# Patient Record
Sex: Female | Born: 1995
Health system: Southern US, Community
[De-identification: ages and names within clinical notes are randomized; demographics above are authoritative.]

## PROBLEM LIST (undated history)

## (undated) DIAGNOSIS — F419 Anxiety disorder, unspecified: Secondary | ICD-10-CM

## (undated) DIAGNOSIS — F329 Major depressive disorder, single episode, unspecified: Secondary | ICD-10-CM

## (undated) DIAGNOSIS — D649 Anemia, unspecified: Secondary | ICD-10-CM

## (undated) DIAGNOSIS — K589 Irritable bowel syndrome without diarrhea: Secondary | ICD-10-CM

## (undated) DIAGNOSIS — R1115 Cyclical vomiting syndrome unrelated to migraine: Secondary | ICD-10-CM

## (undated) DIAGNOSIS — F32A Depression, unspecified: Secondary | ICD-10-CM

## (undated) DIAGNOSIS — Z8489 Family history of other specified conditions: Secondary | ICD-10-CM

## (undated) DIAGNOSIS — B019 Varicella without complication: Secondary | ICD-10-CM

## (undated) HISTORY — DX: Major depressive disorder, single episode, unspecified: F32.9

## (undated) HISTORY — DX: Anxiety disorder, unspecified: F41.9

## (undated) HISTORY — PX: NO PAST SURGERIES: SHX2092

## (undated) HISTORY — DX: Irritable bowel syndrome, unspecified: K58.9

## (undated) HISTORY — DX: Cyclical vomiting syndrome unrelated to migraine: R11.15

## (undated) HISTORY — DX: Varicella without complication: B01.9

## (undated) HISTORY — DX: Depression, unspecified: F32.A

## (undated) HISTORY — DX: Anemia, unspecified: D64.9

---
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
## 2016-08-01 DIAGNOSIS — Z01419 Encounter for gynecological examination (general) (routine) without abnormal findings: Secondary | ICD-10-CM | POA: Diagnosis not present

## 2017-07-23 DIAGNOSIS — Z23 Encounter for immunization: Secondary | ICD-10-CM | POA: Diagnosis not present

## 2017-08-20 ENCOUNTER — Ambulatory Visit: Payer: 59 | Admitting: Physician Assistant

## 2017-08-20 ENCOUNTER — Encounter: Payer: Self-pay | Admitting: Physician Assistant

## 2017-08-20 ENCOUNTER — Other Ambulatory Visit: Payer: Self-pay

## 2017-08-20 VITALS — BP 110/60 | HR 101 | Temp 99.0°F | Resp 16 | Ht 63.0 in | Wt 141.6 lb

## 2017-08-20 DIAGNOSIS — Z113 Encounter for screening for infections with a predominantly sexual mode of transmission: Secondary | ICD-10-CM

## 2017-08-20 DIAGNOSIS — F411 Generalized anxiety disorder: Secondary | ICD-10-CM

## 2017-08-20 DIAGNOSIS — Z8619 Personal history of other infectious and parasitic diseases: Secondary | ICD-10-CM | POA: Insufficient documentation

## 2017-08-20 DIAGNOSIS — F32A Depression, unspecified: Secondary | ICD-10-CM

## 2017-08-20 DIAGNOSIS — Z23 Encounter for immunization: Secondary | ICD-10-CM | POA: Diagnosis not present

## 2017-08-20 DIAGNOSIS — F329 Major depressive disorder, single episode, unspecified: Secondary | ICD-10-CM | POA: Diagnosis not present

## 2017-08-20 DIAGNOSIS — D649 Anemia, unspecified: Secondary | ICD-10-CM | POA: Insufficient documentation

## 2017-08-20 DIAGNOSIS — D241 Benign neoplasm of right breast: Secondary | ICD-10-CM | POA: Insufficient documentation

## 2017-08-20 DIAGNOSIS — D242 Benign neoplasm of left breast: Secondary | ICD-10-CM
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
## 2017-08-20 NOTE — Patient Instructions (Signed)
     IF you received an x-ray today, you will receive an invoice from [facility]. Please contact [facility] at [phone] with questions or concerns regarding your invoice.   IF you received labwork today, you will receive an invoice from [facility]. Please contact [facility] at [phone] with questions or concerns regarding your invoice.   Our billing staff will not be able to assist you with questions regarding bills from these companies.  You will be contacted with the lab results as soon as they are available. The fastest way to get your results is to activate your My Chart account. Instructions are located on the last page of this paperwork. If you have not heard from us regarding the results in 2 weeks, please contact this office.     

## 2017-08-22 LAB — HSV(HERPES SIMPLEX VRS) I + II AB-IGG: HSV 2 IgG, Type Spec: <0.91 index (ref 0.00–0.90)

## 2017-08-22 LAB — WET PREP FOR TRICH, YEAST, CLUE
Clue Cell Exam: NEGATIVE
TRICHOMONAS EXAM: NEGATIVE
Yeast Exam: NEGATIVE

## 2017-08-22 LAB — HIV ANTIBODY (ROUTINE TESTING W REFLEX): HIV Screen 4th Generation wRfx: NONREACTIVE

## 2017-08-22 LAB — HEPATITIS C ANTIBODY: Hep C Virus Ab: <0.1 s/co ratio (ref 0.0–0.9)

## 2017-08-22 LAB — HEPATITIS B SURFACE ANTIBODY, QUANTITATIVE: HEPATITIS B SURF AB QUANT: 29.3 m[IU]/mL

## 2017-08-22 LAB — RPR: RPR Ser Ql: NONREACTIVE

## 2017-08-22 LAB — HEPATITIS B SURFACE ANTIGEN: Hepatitis B Surface Ag: NEGATIVE

## 2017-08-22 LAB — GC/CHLAMYDIA PROBE AMP
Chlamydia trachomatis, NAA: NEGATIVE
NEISSERIA GONORRHOEAE BY PCR: NEGATIVE

## 2017-08-24 ENCOUNTER — Encounter: Payer: Self-pay | Admitting: Physician Assistant

## 2017-08-24 DIAGNOSIS — Z789 Other specified health status: Secondary | ICD-10-CM | POA: Insufficient documentation

## 2017-09-03 ENCOUNTER — Encounter: Payer: Self-pay | Admitting: Physician Assistant

## 2017-09-07 NOTE — Assessment & Plan Note (Signed)
Stable. Continue therapy.

## 2017-09-07 NOTE — Assessment & Plan Note (Signed)
Stable. COntinue therapy.

## 2017-09-27 ENCOUNTER — Encounter: Payer: Self-pay | Admitting: Physician Assistant

## 2018-04-17 DIAGNOSIS — H1031 Unspecified acute conjunctivitis, right eye: Secondary | ICD-10-CM | POA: Diagnosis not present

## 2018-04-20 ENCOUNTER — Other Ambulatory Visit: Payer: Self-pay

## 2018-04-20 ENCOUNTER — Encounter: Payer: Self-pay | Admitting: Physician Assistant

## 2018-04-20 ENCOUNTER — Ambulatory Visit: Payer: 59 | Admitting: Physician Assistant

## 2018-04-20 VITALS — BP 113/69 | HR 83 | Temp 99.3°F | Resp 18 | Ht 63.0 in | Wt 144.6 lb

## 2018-04-20 DIAGNOSIS — L03213 Periorbital cellulitis: Secondary | ICD-10-CM | POA: Diagnosis not present

## 2018-04-20 MED ORDER — SULFAMETHOXAZOLE-TRIMETHOPRIM 800-160 MG PO TABS: 1.0000 | ORAL_TABLET | Freq: Two times a day (BID) | ORAL | 0 refills | Status: DC

## 2018-04-20 MED ORDER — CEPHALEXIN 500 MG PO CAPS: 500.0000 mg | ORAL_CAPSULE | Freq: Three times a day (TID) | ORAL | 0 refills | Status: DC

## 2018-04-20 NOTE — Patient Instructions (Addendum)
Please start antibiotics as prescribed.  You may want to take a probiotic since you will be on two antibiotics. If you develop any fever, pain with eye movements, visual disturbance, nausea, vomiting, or diarrhea, please seek care immediately.  ]Thank you for letting me participate in your health and well being.     If you have lab work done today you will be contacted with your lab results within the next 2 weeks.  If you have not heard from [place] then please contact us. The fastest way to get your results is to register for My Chart.   Preseptal Cellulitis, Adult Preseptal cellulitis-also called periorbital cellulitis-is an infection that can affect your eyelid and the soft tissues or skin that surround your eye. The infection may also affect the structures that produce and drain your tears. It does not affect your eye itself. What are the causes? This condition may be caused by:  Bacterial infection.  Long-term (chronic) sinus infections.  An object (foreign body) that is stuck behind the eye.  An injury that: ? Goes through the eyelid tissues. ? Causes an infection, such as an insect sting.  Fracture of the bone around the eye.  Infections that have spread from the eyelid or other structures around the eye.  Bite wounds.  Inflammation or infection of the lining membranes of the brain (meningitis).  An infection in the blood (septicemia).  Dental infection (abscess).  Viral infection. This is rare.  What increases the risk? Risk factors for preseptal cellulitis include:  Participating in activities that increase your risk of trauma to the face or head, such as boxing or high-speed activities.  Having a weakened defense system (immune system).  Medical conditions, such as nasal polyps, that increase your risk for frequent or recurrent sinus infections.  Not receiving regular dental care.  What are the signs or symptoms? Symptoms of this condition usually come on  suddenly. Symptoms may include:  Red, hot, and swollen eyelids.  Fever.  Difficulty opening your eye.  Eye pain.  How is this diagnosed? This condition may be diagnosed by an eye exam. You may also have tests, such as:  Blood tests.  CT scan.  MRI.  Spinal tap (lumbar puncture). This is a procedure that involves removing and examining a small amount of the fluid that surrounds the brain and spinal cord. This checks for meningitis.  How is this treated? Treatment for this condition will include antibiotic medicines. These may be given by mouth (orally), through an IV, or as a shot. Your health care provider may also recommend nasal decongestants to reduce swelling. Follow these instructions at home:  Take your antibiotic medicine as directed by your health care provider. Finish all of it even if you start to feel better.  Take medicines only as directed by your health care provider.  Drink enough fluid to keep your urine clear or pale yellow.  Do not use any tobacco products, including cigarettes, chewing tobacco, or electronic cigarettes. If you need help quitting, ask your health care provider.  Keep all follow-up visits as directed by your health care provider. These include any visits with an eye specialist (ophthalmologist) or dentist. Contact a health care provider if:  You have a fever.  Your eyelids become more red, warm, or swollen.  You have new symptoms.  Your symptoms do not get better with treatment. Get help right away if:  You develop double vision, or your vision becomes blurred or worsens in any way.  You have trouble moving your eyes.  Your eye looks like it is sticking out or bulging out (proptosis).  You develop a severe headache, severe neck pain, or neck stiffness.  You develop repeated vomiting. This information is not intended to replace advice given to you by your health care provider. Make sure you discuss any questions you have with your  health care provider. Document Released: 06/21/2010 Document Revised: 10/25/2015 Document Reviewed: 05/15/2014 Elsevier Interactive Patient Education  2018 [employer].  IF you received an x-ray today, you will receive an invoice from [facility]. Please contact [facility] at [phone] with questions or concerns regarding your invoice.   IF you received labwork today, you will receive an invoice from [facility]. Please contact [facility] at [phone] with questions or concerns regarding your invoice.   Our billing staff will not be able to assist you with questions regarding bills from these companies.  You will be contacted with the lab results as soon as they are available. The fastest way to get your results is to activate your My Chart account. Instructions are located on the last page of this paperwork. If you have not heard from [place] regarding the results in 2 weeks, please contact this office.

## 2018-04-20 NOTE — Progress Notes (Signed)
Shelly Petty  MRN: [id number] DOB: 22-Jul-1995  Subjective:  Shelly Petty is a 22 y.o. female seen in office today for a chief complaint of right eye issues x 5 days. Went to urgent care 3 days and got dx w/ conjunctivitis. Given Rx for tobramycin eye drops. Using them as prescribed. Has not noticed any difference but it was never the eye that was bothering pt, its the lower eyelid. Has swelling, redness, and discomfort of lower eyelid and pain. Just had 3D xray of sinuses and was told they were clear today at the dentist. Denies eye itching, photophobia, eye redness, eye discharge, visual disturbance, pain with eye movements, and foreign body sensation. Denies trauma. Does not wear contact lens.  Works with the community.  No history of MRSA.  Review of Systems  Constitutional: Negative for chills, diaphoresis and fever.    Patient Active Problem List   Diagnosis Date Noted  . Immune to hepatitis B 08/24/2017  . Anemia 08/20/2017  . History of varicella 08/20/2017  . Fibroadenoma of both breasts 08/20/2017  . Anxiety state 08/17/2012  . Depression 09/05/2011    Current Outpatient Medications on File Prior to Visit  Medication Sig Dispense Refill  . tobramycin (TOBREX) 0.3 % ophthalmic solution      No current facility-administered medications on file prior to visit.     Allergies  Allergen Reactions  . Prozac [Fluoxetine Hcl] Nausea And Vomiting  . Escitalopram Oxalate Nausea And Vomiting    Pt does not do well with SSRI medications     Objective:  BP 113/69   Pulse 83   Temp 99.3 F (37.4 C) (Oral)   Resp 18   Ht 5\' 3"  (1.6 m)   Wt 144 lb 9.6 oz (65.6 kg)   LMP 03/20/2018   SpO2 97%   BMI 25.61 kg/m   Physical Exam  Constitutional: She is oriented to person, place, and time. She appears well-developed and well-nourished. No distress.  HENT:  Head: Normocephalic and atraumatic.  Eyes: Pupils are equal, round, and reactive to light. Conjunctivae and EOM are  normal. Right eye exhibits no discharge and no hordeolum. Left eye exhibits no discharge and no hordeolum. Right conjunctiva is not injected. Left conjunctiva is not injected.  Fundoscopic exam:      The right eye shows no AV nicking, no exudate, no hemorrhage and no papilledema. The right eye shows red reflex.       The left eye shows no AV nicking, no exudate, no hemorrhage and no papilledema. The left eye shows red reflex.    Mild erythema and edema of inferior periorbital region extending to cheek area. No overlying TTP. No pain with EOMs. See image below.    Neck: Normal range of motion.  Pulmonary/Chest: Effort normal.  Neurological: She is alert and oriented to person, place, and time.  Skin: Skin is warm and dry.  Psychiatric: She has a normal mood and affect.  Vitals reviewed.         Visual Acuity Screening   Right eye Left eye Both eyes  Without correction: 20/13 20/13 20/13   With correction:        Assessment and Plan :  1. Preseptal cellulitis Concern for preseptal cellulitis. No concern for orbital cellulitis.  Patient is overall well-appearing, no acute distress.  Afebrile.  No pain with EOMs.  Recommend oral antibiotics with close follow-up.  If any symptoms worsen or develops new concerning symptoms, please return office for further evaluation. - sulfamethoxazole-trimethoprim (  BACTRIM DS,SEPTRA DS) 800-160 MG tablet; Take 1 tablet by mouth 2 (two) times daily.  Dispense: 10 tablet; Refill: 0 - cephALEXin (KEFLEX) 500 MG capsule; Take 1 capsule (500 mg total) by mouth 3 (three) times daily for 5 days.  Dispense: 15 capsule; Refill: 0  Side effects, risks, benefits, and alternatives of the medications and treatment plan prescribed today were discussed, and patient expressed understanding of the instructions given. No barriers to understanding were identified. Red flags discussed in detail. Pt expressed understanding regarding what to do in case of emergency/urgent  symptoms.  Tenna Delaine PA-C  [facility] 04/20/2018 5:05 PM

## 2018-04-22 ENCOUNTER — Encounter: Payer: Self-pay | Admitting: Physician Assistant

## 2018-04-22 ENCOUNTER — Encounter (HOSPITAL_BASED_OUTPATIENT_CLINIC_OR_DEPARTMENT_OTHER): Payer: Self-pay

## 2018-04-22 ENCOUNTER — Ambulatory Visit: Payer: Self-pay

## 2018-04-22 ENCOUNTER — Other Ambulatory Visit: Payer: Self-pay

## 2018-04-22 ENCOUNTER — Emergency Department (HOSPITAL_BASED_OUTPATIENT_CLINIC_OR_DEPARTMENT_OTHER): Payer: 59

## 2018-04-22 ENCOUNTER — Emergency Department (HOSPITAL_BASED_OUTPATIENT_CLINIC_OR_DEPARTMENT_OTHER)
Admission: EM | Admit: 2018-04-22 | Discharge: 2018-04-23 | Disposition: A | Discharge status: Home / Self Care | Payer: 59 | Attending: Emergency Medicine | Admitting: Emergency Medicine

## 2018-04-22 DIAGNOSIS — M549 Dorsalgia, unspecified: Secondary | ICD-10-CM | POA: Insufficient documentation

## 2018-04-22 DIAGNOSIS — R112 Nausea with vomiting, unspecified: Secondary | ICD-10-CM | POA: Insufficient documentation

## 2018-04-22 DIAGNOSIS — R419 Unspecified symptoms and signs involving cognitive functions and awareness: Secondary | ICD-10-CM | POA: Insufficient documentation

## 2018-04-22 DIAGNOSIS — Z79899 Other long term (current) drug therapy: Secondary | ICD-10-CM | POA: Diagnosis not present

## 2018-04-22 DIAGNOSIS — R1114 Bilious vomiting: Secondary | ICD-10-CM | POA: Diagnosis present

## 2018-04-22 DIAGNOSIS — R6883 Chills (without fever): Secondary | ICD-10-CM | POA: Insufficient documentation

## 2018-04-22 DIAGNOSIS — Z87891 Personal history of nicotine dependence: Secondary | ICD-10-CM | POA: Diagnosis not present

## 2018-04-22 LAB — CBC
HCT: 39.6 % (ref 36.0–46.0)
Hemoglobin: 13.2 g/dL (ref 12.0–15.0)
MCH: 30.1 pg (ref 26.0–34.0)
MCHC: 33.3 g/dL (ref 30.0–36.0)
MCV: 90.4 fL (ref 80.0–100.0)
NRBC: 0 % (ref 0.0–0.2)
PLATELETS: 227 10*3/uL (ref 150–400)
RBC: 4.38 MIL/uL (ref 3.87–5.11)
RDW: 11.7 % (ref 11.5–15.5)
WBC: 11.7 10*3/uL — AB (ref 4.0–10.5)

## 2018-04-22 LAB — URINALYSIS, MICROSCOPIC (REFLEX)

## 2018-04-22 LAB — COMPREHENSIVE METABOLIC PANEL
ALBUMIN: 5.4 g/dL — AB (ref 3.5–5.0)
ALT: 15 U/L (ref 0–44)
ANION GAP: 14 (ref 5–15)
AST: 22 U/L (ref 15–41)
Alkaline Phosphatase: 41 U/L (ref 38–126)
BILIRUBIN TOTAL: 0.8 mg/dL (ref 0.3–1.2)
BUN: 9 mg/dL (ref 6–20)
CHLORIDE: 104 mmol/L (ref 98–111)
CO2: 21 mmol/L — ABNORMAL LOW (ref 22–32)
Calcium: 10.2 mg/dL (ref 8.9–10.3)
Creatinine, Ser: 0.73 mg/dL (ref 0.44–1.00)
GFR calc Af Amer: >60 mL/min (ref >60)
GLUCOSE: 129 mg/dL — AB (ref 70–99)
POTASSIUM: 4.5 mmol/L (ref 3.5–5.1)
Sodium: 139 mmol/L (ref 135–145)
TOTAL PROTEIN: 8.2 g/dL — AB (ref 6.5–8.1)

## 2018-04-22 LAB — URINALYSIS, ROUTINE W REFLEX MICROSCOPIC
BILIRUBIN URINE: NEGATIVE
Glucose, UA: NEGATIVE mg/dL
LEUKOCYTES UA: NEGATIVE
NITRITE: NEGATIVE
PROTEIN: NEGATIVE mg/dL
Specific Gravity, Urine: 1.025 (ref 1.005–1.030)
pH: 7 (ref 5.0–8.0)

## 2018-04-22 LAB — PREGNANCY, URINE: Preg Test, Ur: NEGATIVE

## 2018-04-22 LAB — LIPASE, BLOOD: LIPASE: 34 U/L (ref 11–51)

## 2018-04-22 MED ORDER — PROMETHAZINE HCL 25 MG/ML IJ SOLN: 25.0000 mg | Freq: Once | INTRAMUSCULAR | Status: DC

## 2018-04-22 MED ORDER — ONDANSETRON HCL 4 MG/2ML IJ SOLN
4.0000 mg | Freq: Once | INTRAMUSCULAR | Status: DC
  Filled 2018-04-22: qty 2

## 2018-04-22 MED ORDER — SODIUM CHLORIDE 0.9 % IV BOLUS
1000.0000 mL | Freq: Once | INTRAVENOUS | Status: AC
  Administered 2018-04-22: 1000 mL via INTRAVENOUS

## 2018-04-22 MED ORDER — FAMOTIDINE IN NACL 20-0.9 MG/50ML-% IV SOLN
20.0000 mg | Freq: Once | INTRAVENOUS | Status: AC
  Administered 2018-04-22: 20 mg via INTRAVENOUS
  Filled 2018-04-22: qty 50

## 2018-04-22 MED ORDER — DICYCLOMINE HCL 10 MG/ML IM SOLN
20.0000 mg | Freq: Once | INTRAMUSCULAR | Status: AC
  Administered 2018-04-22: 20 mg via INTRAMUSCULAR
  Filled 2018-04-22: qty 2

## 2018-04-22 MED ORDER — LORAZEPAM 2 MG/ML IJ SOLN
1.0000 mg | Freq: Once | INTRAMUSCULAR | Status: AC
  Administered 2018-04-22: 1 mg via INTRAVENOUS
  Filled 2018-04-22: qty 1

## 2018-04-22 MED ORDER — ONDANSETRON HCL 4 MG/2ML IJ SOLN
4.0000 mg | Freq: Once | INTRAMUSCULAR | Status: AC
  Administered 2018-04-22: 4 mg via INTRAVENOUS
  Filled 2018-04-22: qty 2

## 2018-04-22 MED ORDER — SODIUM CHLORIDE 0.9 % IV BOLUS: 1000.0000 mL | Freq: Once | INTRAVENOUS | Status: DC

## 2018-04-22 NOTE — Telephone Encounter (Signed)
Patient called in with c/o "vomiting." She says "I was put on these 2 antibiotics, Bactrim and Keflex on Tuesday. Today I took them both and vomited. I've been vomiting every 20 minutes since taking and I can't even keep water down. In the past when I had vomiting like this, I was prescribed Phenergan suppositories and I was wondering if something can be done." I advised to go to the ED for evaluation, since unable to keep water down, she asked if there is someone in the office she can talk to. I placed her on hold and called the office and spoke to [place], [facility]. She said to send a note over high priority and she will get someone in clinical to review it. I advised the patient and she says she will wait on a call back.  Reason for Disposition . Caller has URGENT medication question about med that PCP prescribed and triager unable to answer question  Answer Assessment - Initial Assessment Questions 1. SYMPTOMS: "Do you have any symptoms?"     Vomiting 2. SEVERITY: If symptoms are present, ask "Are they mild, moderate or severe?"     Severe-unable to keep anything down, vomiting every 20 minutes.  Protocols used: MEDICATION QUESTION CALL-A-AH

## 2018-04-22 NOTE — ED Notes (Signed)
Pt continued to have episode of emesis following medication administration, they state they are doing better at this point. More warm blankets given and they deny any needs currently.

## 2018-04-22 NOTE — ED Triage Notes (Signed)
Pt states she is started abx for cellulitis on 11/19-now having n/v-to triage in w/c

## 2018-04-22 NOTE — ED Provider Notes (Signed)
[place] EMERGENCY DEPARTMENT Provider Note   CSN: [id number] Arrival date & time: 04/22/18  1727     History   Chief Complaint Chief Complaint  Patient presents with  . Emesis    HPI Shelly [facility]ty is a 22 y.o. gender non-binary person (they,them, theirs) who presents to the emergency department with a chief complaint of emesis.  The patient endorses sudden onset bilious emesis since this morning with nausea, chills, and right-sided mid-back pain.  No fever abdominal pain, dysuria, hematuria, hematemesis, melena, hematochezia, rectal pain, chest pain, dyspnea, or rash.  The patient states that they feel that her stomach has been more upset and they have been feeling more anxious since onset of symptoms.  They report they were initially started on tobramycin for conjunctivitis presenting at urgent care with right eye complaints.  They report they followed up with primary care on 04/20/2018 with no improvement in discomfort, redness, and swelling to the right lower eyelid.  Patient was also seen by dentistry after having some right-sided jaw pain and had a 3D x-ray of the sinuses and was told they were unremarkable.  The patient denies pruritus of the eye, photophobia, eye discharge, blurred vision, diplopia, foreign body sensation, or pain with movements.  Reports the right lower eyelid pain has significantly improved and is nontender at this time.  Reports she was previously on Ativan for anxiety.  Per chart review, the patient has had nausea and vomiting previously but is worse with anxiety.  They are a non-smoker, deny alcohol, and IV or recreational drug use.  The history is provided by the patient. No language interpreter was used.    Past Medical History:  Diagnosis Date  . Anemia   . Chicken pox   . Depression     Patient Active Problem List   Diagnosis Date Noted  . Immune to hepatitis B 08/24/2017  . Anemia 08/20/2017  . History of varicella 08/20/2017    . Fibroadenoma of both breasts 08/20/2017  . Anxiety state 08/17/2012  . Depression 09/05/2011    Past Surgical History:  Procedure Laterality Date  . NO PAST SURGERIES       OB History   None      Home Medications    Prior to Admission medications   Medication Sig Start Date End Date Taking? Authorizing Provider  cephALEXin (KEFLEX) 500 MG capsule Take 1 capsule (500 mg total) by mouth 3 (three) times daily for 5 days. 04/20/18 04/25/18  Tenna Delaine D, PA-C  dicyclomine (BENTYL) 20 MG tablet Take 1 tablet (20 mg total) by mouth 2 (two) times daily. 04/23/18   Icela Glymph A, PA-C  promethazine (PHENERGAN) 25 MG suppository Place 1 suppository (25 mg total) rectally every 6 (six) hours as needed for nausea or vomiting. 04/23/18   Sheela Mcculley A, PA-C  sulfamethoxazole-trimethoprim (BACTRIM DS,SEPTRA DS) 800-160 MG tablet Take 1 tablet by mouth 2 (two) times daily. 04/20/18   Tenna Delaine D, PA-C  tobramycin (TOBREX) 0.3 % ophthalmic solution  04/17/18   [provider]    Family History Family History  Problem Relation Age of Onset  . Mental illness Mother        depression  . Cancer Mother        melanoma  . Diabetes Father   . Hypertension Father   . Hyperlipidemia Father   . Mental illness Sister   . Mental illness Brother   . Drug abuse Brother     Social History Social  History   Tobacco Use  . Smoking status: Former Research scientist (life sciences)  . Smokeless tobacco: Never Used  . Tobacco comment: never regular smoking  Substance Use Topics  . Alcohol use: Yes    Frequency: Never    Comment: once a year, on Christmas, with her mother  . Drug use: Not Currently    Comment: previous marijuana use (last use age 10)     Allergies   Prozac [fluoxetine hcl] and Escitalopram oxalate   Review of Systems Review of Systems  Constitutional: Positive for chills. Negative for activity change and fever.  HENT: Negative for congestion, sinus pressure and sinus  pain.   Eyes: Negative for photophobia, pain, discharge, redness, itching and visual disturbance.  Respiratory: Negative for shortness of breath.   Cardiovascular: Negative for chest pain.  Gastrointestinal: Positive for nausea and vomiting. Negative for abdominal pain, anal bleeding, blood in stool, constipation and diarrhea.  Genitourinary: Negative for dysuria, flank pain, hematuria, urgency, vaginal discharge and vaginal pain.  Musculoskeletal: Positive for back pain.  Skin: Negative for rash.  Allergic/Immunologic: Negative for immunocompromised state.  Neurological: Negative for seizures, weakness, numbness and headaches.  Psychiatric/Behavioral: Negative for confusion.   Physical Exam Updated Vital Signs BP 105/63   Pulse 86   Temp 98.5 F (36.9 C) (Oral)   Resp 18   LMP 04/21/2018   SpO2 100%   Physical Exam  Constitutional: No distress.  HENT:  Head: Normocephalic.  Right Ear: Hearing, tympanic membrane and ear canal normal.  Left Ear: Hearing, tympanic membrane and ear canal normal.  Nose: Nose normal.  No foreign bodies. Right sinus exhibits no maxillary sinus tenderness and no frontal sinus tenderness. Left sinus exhibits no maxillary sinus tenderness and no frontal sinus tenderness.  Mouth/Throat: Uvula is midline, oropharynx is clear and moist and mucous membranes are normal. No tonsillar exudate.  No tenderness to palpation to the bilateral jaw.  Eyes: Pupils are equal, round, and reactive to light. Conjunctivae and EOM are normal.  Minimal erythema noted to the right lower eyelid.  No hordeolum or chalazion.  No abrasion or laceration.  No edema or warmth.  Area is nontender to palpation.  Neck: Normal range of motion. Neck supple.  Cardiovascular: Normal rate, regular rhythm, normal heart sounds and intact distal pulses. Exam reveals no gallop and no friction rub.  No murmur heard. Pulmonary/Chest: Effort normal. No stridor. No respiratory distress. She has no  wheezes. She has no rales. She exhibits no tenderness.  Abdominal: Soft. Bowel sounds are normal. She exhibits no distension and no mass. There is no tenderness. There is no rebound and no guarding. No hernia.  Abdomen is soft, nontender, nondistended.  Mild tenderness palpation in the right CVA area.  No left-sided CVA tenderness.  Negative Murphy sign.  No tenderness over McBurney's point.  No rebound or guarding.  Neurological: She is alert.  Skin: Skin is warm. No rash noted. There is pallor.  Psychiatric: Her behavior is normal. Her mood appears anxious.  Nursing note and vitals reviewed.    ED Treatments / Results  Labs (all labs ordered are listed, but only abnormal results are displayed) Labs Reviewed  URINALYSIS, ROUTINE W REFLEX MICROSCOPIC - Abnormal; Notable for the following components:      Result Value   Hgb urine dipstick LARGE (*)    Ketones, ur >80 (*)    All other components within normal limits  CBC - Abnormal; Notable for the following components:   WBC 11.7 (*)  All other components within normal limits  COMPREHENSIVE METABOLIC PANEL - Abnormal; Notable for the following components:   CO2 21 (*)    Glucose, Bld 129 (*)    Total Protein 8.2 (*)    Albumin 5.4 (*)    All other components within normal limits  URINALYSIS, MICROSCOPIC (REFLEX) - Abnormal; Notable for the following components:   Bacteria, UA RARE (*)    All other components within normal limits  PREGNANCY, URINE  LIPASE, BLOOD    EKG None  Radiology US Abdomen Limited Ruq  Result Date: 04/22/2018 CLINICAL DATA:  Nausea and vomiting with back pain x1 day. EXAM: ULTRASOUND ABDOMEN LIMITED RIGHT UPPER QUADRANT COMPARISON:  None. FINDINGS: Gallbladder: No gallstones or wall thickening visualized. No sonographic Murphy sign noted by sonographer. Common bile duct: Diameter: 1.8 mm Liver: No focal lesion identified. Within normal limits in parenchymal echogenicity. Portal vein is patent on color  Doppler imaging with normal direction of blood flow towards the liver. IMPRESSION: Normal right upper quadrant abdominal ultrasound. Electronically Signed   By: Ashley Royalty M.D.   On: 04/22/2018 21:17    Procedures Procedures (including critical care time)  Medications Ordered in ED Medications  sodium chloride 0.9 % bolus 1,000 mL (0 mLs Intravenous Stopped 04/22/18 2008)  LORazepam (ATIVAN) injection 1 mg (1 mg Intravenous Given 04/22/18 1929)  sodium chloride 0.9 % bolus 1,000 mL (0 mLs Intravenous Stopped 04/22/18 2140)  ondansetron (ZOFRAN) injection 4 mg (4 mg Intravenous Given 04/22/18 1928)  dicyclomine (BENTYL) injection 20 mg (20 mg Intramuscular Given 04/22/18 2200)  famotidine (PEPCID) IVPB 20 mg premix (0 mg Intravenous Stopped 04/22/18 2227)  LORazepam (ATIVAN) injection 1 mg (1 mg Intravenous Given 04/22/18 2322)  promethazine (PHENERGAN) injection 12.5 mg (12.5 mg Intravenous Given 04/23/18 0206)     Initial Impression / Assessment and Plan / ED Course  I have reviewed the triage vital signs and the nursing notes.  Pertinent labs & imaging results that were available during my care of the patient were reviewed by me and considered in my medical decision making (see chart for details).     22 year old gender nonbinary person (they/them/theirs) presenting with nausea and vomiting, onset today with right mid back pain.  On exam, abdomen is not surgical and is soft, nondistended, nontender.  There is mild right CVA tenderness.  Patient appears anxious.  They are concerned symptoms may be related to Bactrim and Keflex that was initiated yesterday.  The patient was also recently on tobramycin for presumed conjunctivitis.  The symptoms have resolved at this time.  IV fluids and Ativan given.  Given concern for bilious emesis with right CVA tenderness, will order right upper quadrant ultrasound to assess for cholecystitis.  Right upper quadrant ultrasound is unremarkable.  UA is  notable for ketonuria and large hemoglobinuria.  The patient is also currently on their menstrual cycle, which explains hemoglobinuria.  Labs are otherwise reassuring.  The patient was observed for multiple hours and received 2 IV fluid boluses and multiple doses of antiemetics.  Fluid challenge was attempted by nursing staff, but the patient continued to endorse nausea and was not willing to fully participate in the fluid challenge for several hours.  After several attempts, the patient was successfully fluid challenged and was agreeable to discharge with Phenergan suppositories, Bentyl, and PCP follow-up.  The patient was also given a referral to [facility].  After initiating discharge paperwork and prescriptions, notified by nursing staff that the patient had emesis upon arrival into  the room.  Shared decision making conversation between the patient and their partner.  The patient would prefer to be discharged home with antiemetics as discussed.  The patient was also requesting Ativan, which I discussed that if it was needed should be given by PCP or the patient could follow-up with [facility].  There is some concern that part of the patient's symptoms may be psychogenic in nature given history.  Labs do not seem suspicious for infection, which was discussed.  The patient was requesting a third bolus of fluids, but at this time has no symptoms of clinical dehydration, including tachycardia and the patient's color is much improved.  The patient is agreeable to discharge to home with medications as listed above and follow-up with [facility] and/or PCP.  Strict return precautions given.  The patient is hemodynamically stable and in no acute distress.  Safe for discharge to home with outpatient follow-up at this time.  Final Clinical Impressions(s) / ED Diagnoses   Final diagnoses:  Bilious emesis  Non-intractable vomiting with nausea, unspecified vomiting type    ED Discharge Orders         Ordered     promethazine (PHENERGAN) 25 MG suppository  Every 6 hours PRN     04/23/18 0149    dicyclomine (BENTYL) 20 MG tablet  2 times daily     04/23/18 0149           Torrie Lafavor A, PA-C 04/23/18 0236    Malvin Johns, MD 04/25/18 1235

## 2018-04-23 MED ORDER — PROMETHAZINE HCL 25 MG RE SUPP: 25.0000 mg | Freq: Four times a day (QID) | RECTAL | 0 refills | Status: DC | PRN

## 2018-04-23 MED ORDER — DICYCLOMINE HCL 20 MG PO TABS: 20.0000 mg | ORAL_TABLET | Freq: Two times a day (BID) | ORAL | 0 refills | Status: DC

## 2018-04-23 MED ORDER — PROMETHAZINE HCL 25 MG/ML IJ SOLN
12.5000 mg | Freq: Once | INTRAMUSCULAR | Status: AC
  Administered 2018-04-23: 12.5 mg via INTRAVENOUS
  Filled 2018-04-23: qty 1

## 2018-04-23 NOTE — ED Notes (Signed)
Pt verbalizes understanding of d/c instructions and denies any further needs at this time. 

## 2018-04-23 NOTE — Discharge Instructions (Addendum)
Thank you for allowing me to care for you today in the Emergency Department.   Your labs can be viewed if you download the my chart app.  Take 1 tablet of Bentyl by mouth 2 times daily as needed for abdominal pain.  You can use 1 Phenergan suppository every 6 hours as needed for nausea or vomiting.  Take small sips of fluids to avoid dehydration.  You can try following a bland diet for the next few days to try and improve your upset stomach.  Please follow-up with your primary care provider for recheck either tomorrow or when they reopen on Monday or Tuesday.  Other your blood work was reassuring today and did not reveal an exact cause of your symptoms, if you develop persistent vomiting despite using the Phenergan suppositories, a high fever, severe abdominal pain, blood in your vomit or stool, or other new, concerning symptoms, please return to the emergency department for re-evaluation.

## 2018-04-23 NOTE — Telephone Encounter (Signed)
Patient called and wanted advise on continuing her antibiotics. She says she missed yesterday afternoon and evening doses of her antibiotics and wanted to what to do as far as those missed doses. I advised to take her medication as prescribed and take the missed doses at the end, which will add an extra day. Patient verbalized understanding.

## 2018-04-24 ENCOUNTER — Other Ambulatory Visit: Payer: Self-pay

## 2018-04-24 ENCOUNTER — Encounter (HOSPITAL_BASED_OUTPATIENT_CLINIC_OR_DEPARTMENT_OTHER): Payer: Self-pay | Admitting: Emergency Medicine

## 2018-04-24 ENCOUNTER — Emergency Department (HOSPITAL_BASED_OUTPATIENT_CLINIC_OR_DEPARTMENT_OTHER)
Admission: EM | Admit: 2018-04-24 | Discharge: 2018-04-24 | Disposition: A | Discharge status: Home / Self Care | Payer: 59 | Attending: Emergency Medicine | Admitting: Emergency Medicine

## 2018-04-24 DIAGNOSIS — Z79899 Other long term (current) drug therapy: Secondary | ICD-10-CM | POA: Insufficient documentation

## 2018-04-24 DIAGNOSIS — Y829 Unspecified medical devices associated with adverse incidents: Secondary | ICD-10-CM | POA: Diagnosis not present

## 2018-04-24 DIAGNOSIS — R112 Nausea with vomiting, unspecified: Secondary | ICD-10-CM | POA: Diagnosis not present

## 2018-04-24 DIAGNOSIS — E860 Dehydration: Secondary | ICD-10-CM | POA: Insufficient documentation

## 2018-04-24 DIAGNOSIS — Z87891 Personal history of nicotine dependence: Secondary | ICD-10-CM | POA: Diagnosis not present

## 2018-04-24 DIAGNOSIS — T887XXA Unspecified adverse effect of drug or medicament, initial encounter: Secondary | ICD-10-CM | POA: Diagnosis present

## 2018-04-24 LAB — URINALYSIS, ROUTINE W REFLEX MICROSCOPIC
Bilirubin Urine: NEGATIVE
Glucose, UA: NEGATIVE mg/dL
Ketones, ur: >80 mg/dL — AB
LEUKOCYTES UA: NEGATIVE
Nitrite: NEGATIVE
Protein, ur: NEGATIVE mg/dL
Specific Gravity, Urine: 1.02 (ref 1.005–1.030)
pH: 8.5 — ABNORMAL HIGH (ref 5.0–8.0)

## 2018-04-24 LAB — COMPREHENSIVE METABOLIC PANEL
ALT: 16 U/L (ref 0–44)
AST: 25 U/L (ref 15–41)
Albumin: 5.3 g/dL — ABNORMAL HIGH (ref 3.5–5.0)
Alkaline Phosphatase: 41 U/L (ref 38–126)
Anion gap: 11 (ref 5–15)
BUN: 10 mg/dL (ref 6–20)
CHLORIDE: 104 mmol/L (ref 98–111)
CO2: 20 mmol/L — ABNORMAL LOW (ref 22–32)
Calcium: 9.7 mg/dL (ref 8.9–10.3)
Creatinine, Ser: 0.7 mg/dL (ref 0.44–1.00)
Glucose, Bld: 113 mg/dL — ABNORMAL HIGH (ref 70–99)
POTASSIUM: 3.5 mmol/L (ref 3.5–5.1)
SODIUM: 135 mmol/L (ref 135–145)
Total Bilirubin: 0.6 mg/dL (ref 0.3–1.2)
Total Protein: 8.1 g/dL (ref 6.5–8.1)

## 2018-04-24 LAB — URINALYSIS, MICROSCOPIC (REFLEX)

## 2018-04-24 LAB — CBC
HCT: 38.1 % (ref 36.0–46.0)
Hemoglobin: 12.7 g/dL (ref 12.0–15.0)
MCH: 30.3 pg (ref 26.0–34.0)
MCHC: 33.3 g/dL (ref 30.0–36.0)
MCV: 90.9 fL (ref 80.0–100.0)
PLATELETS: 246 10*3/uL (ref 150–400)
RBC: 4.19 MIL/uL (ref 3.87–5.11)
RDW: 12 % (ref 11.5–15.5)
WBC: 9.5 10*3/uL (ref 4.0–10.5)
nRBC: 0 % (ref 0.0–0.2)

## 2018-04-24 LAB — PREGNANCY, URINE: PREG TEST UR: NEGATIVE

## 2018-04-24 LAB — LIPASE, BLOOD: Lipase: 39 U/L (ref 11–51)

## 2018-04-24 MED ORDER — ONDANSETRON HCL 4 MG/2ML IJ SOLN
4.0000 mg | Freq: Once | INTRAMUSCULAR | Status: AC
  Administered 2018-04-24: 4 mg via INTRAVENOUS

## 2018-04-24 MED ORDER — ONDANSETRON HCL 4 MG/2ML IJ SOLN
INTRAMUSCULAR | Status: AC
  Filled 2018-04-24: qty 2

## 2018-04-24 MED ORDER — LORAZEPAM 1 MG PO TABS: 1.0000 mg | ORAL_TABLET | Freq: Three times a day (TID) | ORAL | 0 refills | Status: DC | PRN

## 2018-04-24 MED ORDER — SODIUM CHLORIDE 0.9 % IV BOLUS
1000.0000 mL | Freq: Once | INTRAVENOUS | Status: AC
  Administered 2018-04-24: 1000 mL via INTRAVENOUS

## 2018-04-24 MED ORDER — LORAZEPAM 2 MG/ML IJ SOLN
1.0000 mg | Freq: Once | INTRAMUSCULAR | Status: AC
  Administered 2018-04-24: 1 mg via INTRAVENOUS
  Filled 2018-04-24: qty 1

## 2018-04-24 MED ORDER — METOCLOPRAMIDE HCL 5 MG/ML IJ SOLN
10.0000 mg | Freq: Once | INTRAMUSCULAR | Status: AC
  Administered 2018-04-24: 10 mg via INTRAVENOUS
  Filled 2018-04-24: qty 2

## 2018-04-24 NOTE — ED Notes (Signed)
ED Provider at bedside. 

## 2018-04-24 NOTE — ED Provider Notes (Signed)
[facility] Provider Note   CSN: [id number] Arrival date & time: 04/24/18  1217     History   Chief Complaint Chief Complaint  Patient presents with  . Medication Reaction    HPI Shelly Petty is a 22 y.o. female.  HPI Patient is a 22 year old female presents emergency department ongoing persistent nausea and vomiting without diarrhea.  She was recently seen in the emergency department for the same thing.  She was treated with antibiotics by her primary care physician for what sounds like a possible eye infection/stye with associated facial cellulitis.  She has been on Keflex and Bactrim.  She believes that the medications that are making her nauseated.  She states that when she becomes nauseated she becomes extremely anxious and continues to shake and this makes her feel worse.  She is had difficulty getting her nausea under control.  She is tried rectal Phenergan at home without improvement in her symptoms.  She denies focal abdominal pain.  No fevers or chills.  No back pain.  No dysuria urinary frequency.  No vaginal complaints.  She reports decreased oral intake and believes she is dehydrated.   Past Medical History:  Diagnosis Date  . Anemia   . Chicken pox   . Depression     Patient Active Problem List   Diagnosis Date Noted  . Immune to hepatitis B 08/24/2017  . Anemia 08/20/2017  . History of varicella 08/20/2017  . Fibroadenoma of both breasts 08/20/2017  . Anxiety state 08/17/2012  . Depression 09/05/2011    Past Surgical History:  Procedure Laterality Date  . NO PAST SURGERIES       OB History   None      Home Medications    Prior to Admission medications   Medication Sig Start Date End Date Taking? Authorizing Provider  dicyclomine (BENTYL) 20 MG tablet Take 1 tablet (20 mg total) by mouth 2 (two) times daily. 04/23/18   McDonald, Mia A, PA-C  LORazepam (ATIVAN) 1 MG tablet Take 1 tablet (1 mg total) by mouth 3  (three) times daily as needed for anxiety (nausea). 04/24/18   Jola Schmidt, MD  promethazine (PHENERGAN) 25 MG suppository Place 1 suppository (25 mg total) rectally every 6 (six) hours as needed for nausea or vomiting. 04/23/18   McDonald, Laymond Purser, PA-C    Family History Family History  Problem Relation Age of Onset  . Mental illness Mother        depression  . Cancer Mother        melanoma  . Diabetes Father   . Hypertension Father   . Hyperlipidemia Father   . Mental illness Sister   . Mental illness Brother   . Drug abuse Brother     Social History Social History   Tobacco Use  . Smoking status: Former Research scientist (life sciences)  . Smokeless tobacco: Never Used  . Tobacco comment: never regular smoking  Substance Use Topics  . Alcohol use: Yes    Frequency: Never    Comment: once a year, on Christmas, with her mother  . Drug use: Not Currently    Comment: previous marijuana use (last use age 64)     Allergies   Prozac [fluoxetine hcl] and Escitalopram oxalate   Review of Systems Review of Systems  All other systems reviewed and are negative.    Physical Exam Updated Vital Signs BP 129/83 (BP Location: Right Arm)   Pulse 82   Temp 98.8 F (37.1 C) (  Oral)   Resp 16   Ht 5\' 3"  (1.6 m)   Wt 65.6 kg   LMP 04/21/2018   SpO2 98%   BMI 25.62 kg/m   Physical Exam  Constitutional: She is oriented to person, place, and time. She appears well-developed and well-nourished. No distress.  HENT:  Head: Normocephalic and atraumatic.  Eyes: EOM are normal.  Neck: Normal range of motion.  Cardiovascular: Normal rate, regular rhythm and normal heart sounds.  Pulmonary/Chest: Effort normal and breath sounds normal.  Abdominal: Soft. She exhibits no distension. There is no tenderness.  Musculoskeletal: Normal range of motion.  Neurological: She is alert and oriented to person, place, and time.  Skin: Skin is warm and dry.  Psychiatric: She has a normal mood and affect. Judgment  normal.  Nursing note and vitals reviewed.    ED Treatments / Results  Labs (all labs ordered are listed, but only abnormal results are displayed) Labs Reviewed  COMPREHENSIVE METABOLIC PANEL - Abnormal; Notable for the following components:      Result Value   CO2 20 (*)    Glucose, Bld 113 (*)    Albumin 5.3 (*)    All other components within normal limits  URINALYSIS, ROUTINE W REFLEX MICROSCOPIC - Abnormal; Notable for the following components:   pH 8.5 (*)    Hgb urine dipstick SMALL (*)    Ketones, ur >80 (*)    All other components within normal limits  URINALYSIS, MICROSCOPIC (REFLEX) - Abnormal; Notable for the following components:   Bacteria, UA MANY (*)    All other components within normal limits  CBC  LIPASE, BLOOD  PREGNANCY, URINE    EKG None  Radiology US Abdomen Limited Ruq  Result Date: 04/22/2018 CLINICAL DATA:  Nausea and vomiting with back pain x1 day. EXAM: ULTRASOUND ABDOMEN LIMITED RIGHT UPPER QUADRANT COMPARISON:  None. FINDINGS: Gallbladder: No gallstones or wall thickening visualized. No sonographic Murphy sign noted by sonographer. Common bile duct: Diameter: 1.8 mm Liver: No focal lesion identified. Within normal limits in parenchymal echogenicity. Portal vein is patent on color Doppler imaging with normal direction of blood flow towards the liver. IMPRESSION: Normal right upper quadrant abdominal ultrasound. Electronically Signed   By: Ashley Royalty M.D.   On: 04/22/2018 21:17    Procedures Procedures (including critical care time)  Medications Ordered in ED Medications  sodium chloride 0.9 % bolus 1,000 mL (0 mLs Intravenous Stopped 04/24/18 1448)  ondansetron (ZOFRAN) injection 4 mg (4 mg Intravenous Given 04/24/18 1317)  metoCLOPramide (REGLAN) injection 10 mg (10 mg Intravenous Given 04/24/18 1518)  sodium chloride 0.9 % bolus 1,000 mL ( Intravenous Stopped 04/24/18 1621)  LORazepam (ATIVAN) injection 1 mg (1 mg Intravenous Given  04/24/18 1517)     Initial Impression / Assessment and Plan / ED Course  I have reviewed the triage vital signs and the nursing notes.  Pertinent labs & imaging results that were available during my care of the patient were reviewed by me and considered in my medical decision making (see chart for details).     Feels better after treatment here in the emergency department.  Hydrated.  Labs and urine without significant abnormality.  Improvement with antiemetics and antianxiety medicine.  Home with short course of Ativan for symptom control.  Primary care follow-up.  Patient encouraged to return to the ER for new or worsening symptoms.  Repeat abdominal exam without focal tenderness.  No indication for advanced imaging.  Final Clinical Impressions(s) / ED Diagnoses  Final diagnoses:  Acute dehydration  Non-intractable vomiting with nausea, unspecified vomiting type    ED Discharge Orders         Ordered    LORazepam (ATIVAN) 1 MG tablet  3 times daily PRN     04/24/18 1629           Jola Schmidt, MD 04/24/18 [id number]

## 2018-04-24 NOTE — ED Triage Notes (Addendum)
Patient states that she is shaking allover - she reports that she has "really bad reactions" to nausea and vomiting and she is Nauseated because she is on antibiotics patient is having bilateral leg tremors in triage and states that she has discomfort in her stomach but denies any pain - " I would like to be put back on the fluids and given something else for nausea that I have not taken yet"

## 2018-04-26 ENCOUNTER — Observation Stay (HOSPITAL_COMMUNITY)
Admission: EM | Admit: 2018-04-26 | Discharge: 2018-04-28 | Disposition: A | Discharge status: Home / Self Care | Payer: 59 | Attending: Internal Medicine | Admitting: Internal Medicine

## 2018-04-26 ENCOUNTER — Other Ambulatory Visit: Payer: Self-pay

## 2018-04-26 ENCOUNTER — Ambulatory Visit: Payer: 59 | Admitting: Physician Assistant

## 2018-04-26 ENCOUNTER — Encounter (HOSPITAL_COMMUNITY): Payer: Self-pay | Admitting: Emergency Medicine

## 2018-04-26 ENCOUNTER — Encounter: Payer: Self-pay | Admitting: Family Medicine

## 2018-04-26 DIAGNOSIS — R110 Nausea: Secondary | ICD-10-CM | POA: Diagnosis not present

## 2018-04-26 DIAGNOSIS — Z87891 Personal history of nicotine dependence: Secondary | ICD-10-CM | POA: Insufficient documentation

## 2018-04-26 DIAGNOSIS — Z79899 Other long term (current) drug therapy: Secondary | ICD-10-CM | POA: Diagnosis not present

## 2018-04-26 DIAGNOSIS — R112 Nausea with vomiting, unspecified: Secondary | ICD-10-CM | POA: Diagnosis present

## 2018-04-26 DIAGNOSIS — R0602 Shortness of breath: Secondary | ICD-10-CM | POA: Diagnosis not present

## 2018-04-26 DIAGNOSIS — K5900 Constipation, unspecified: Secondary | ICD-10-CM | POA: Insufficient documentation

## 2018-04-26 DIAGNOSIS — F419 Anxiety disorder, unspecified: Secondary | ICD-10-CM | POA: Insufficient documentation

## 2018-04-26 DIAGNOSIS — R1115 Cyclical vomiting syndrome unrelated to migraine: Secondary | ICD-10-CM | POA: Diagnosis not present

## 2018-04-26 DIAGNOSIS — F411 Generalized anxiety disorder: Secondary | ICD-10-CM | POA: Diagnosis present

## 2018-04-26 DIAGNOSIS — F329 Major depressive disorder, single episode, unspecified: Secondary | ICD-10-CM | POA: Insufficient documentation

## 2018-04-26 DIAGNOSIS — E876 Hypokalemia: Secondary | ICD-10-CM | POA: Insufficient documentation

## 2018-04-26 DIAGNOSIS — R109 Unspecified abdominal pain: Secondary | ICD-10-CM | POA: Diagnosis not present

## 2018-04-26 DIAGNOSIS — R0789 Other chest pain: Secondary | ICD-10-CM | POA: Diagnosis not present

## 2018-04-26 HISTORY — DX: Family history of other specified conditions: Z84.89

## 2018-04-26 LAB — URINALYSIS, ROUTINE W REFLEX MICROSCOPIC
Bilirubin Urine: NEGATIVE
Glucose, UA: NEGATIVE mg/dL
Ketones, ur: 80 mg/dL — AB
Leukocytes, UA: NEGATIVE
Nitrite: NEGATIVE
Protein, ur: NEGATIVE mg/dL
Specific Gravity, Urine: 1.014 (ref 1.005–1.030)
pH: 7 (ref 5.0–8.0)

## 2018-04-26 LAB — POC URINE PREG, ED: Preg Test, Ur: NEGATIVE

## 2018-04-26 LAB — HEPATIC FUNCTION PANEL
ALT: 18 U/L (ref 0–44)
AST: 24 U/L (ref 15–41)
Albumin: 5.1 g/dL — ABNORMAL HIGH (ref 3.5–5.0)
Alkaline Phosphatase: 39 U/L (ref 38–126)
Bilirubin, Direct: 0.3 mg/dL — ABNORMAL HIGH (ref 0.0–0.2)
Indirect Bilirubin: 0.9 mg/dL (ref 0.3–0.9)
Total Bilirubin: 1.2 mg/dL (ref 0.3–1.2)
Total Protein: 7.9 g/dL (ref 6.5–8.1)

## 2018-04-26 LAB — CBC
HEMATOCRIT: 40.7 % (ref 36.0–46.0)
Hemoglobin: 13.4 g/dL (ref 12.0–15.0)
MCH: 30.2 pg (ref 26.0–34.0)
MCHC: 32.9 g/dL (ref 30.0–36.0)
MCV: 91.9 fL (ref 80.0–100.0)
NRBC: 0 % (ref 0.0–0.2)
PLATELETS: 216 10*3/uL (ref 150–400)
RBC: 4.43 MIL/uL (ref 3.87–5.11)
RDW: 11.6 % (ref 11.5–15.5)
WBC: 8.7 10*3/uL (ref 4.0–10.5)

## 2018-04-26 LAB — BASIC METABOLIC PANEL
ANION GAP: 12 (ref 5–15)
BUN: 7 mg/dL (ref 6–20)
CALCIUM: 9.7 mg/dL (ref 8.9–10.3)
CO2: 20 mmol/L — ABNORMAL LOW (ref 22–32)
Chloride: 107 mmol/L (ref 98–111)
Creatinine, Ser: 0.76 mg/dL (ref 0.44–1.00)
GFR calc Af Amer: >60 mL/min (ref >60)
Glucose, Bld: 106 mg/dL — ABNORMAL HIGH (ref 70–99)
Potassium: 4.3 mmol/L (ref 3.5–5.1)
Sodium: 139 mmol/L (ref 135–145)

## 2018-04-26 LAB — RAPID URINE DRUG SCREEN, HOSP PERFORMED
Amphetamines: NOT DETECTED
Barbiturates: NOT DETECTED
Benzodiazepines: NOT DETECTED
Cocaine: NOT DETECTED
Opiates: NOT DETECTED
Tetrahydrocannabinol: NOT DETECTED

## 2018-04-26 LAB — LIPASE, BLOOD: Lipase: 33 U/L (ref 11–51)

## 2018-04-26 MED ORDER — DICYCLOMINE HCL 10 MG/ML IM SOLN
20.0000 mg | Freq: Once | INTRAMUSCULAR | Status: DC
  Filled 2018-04-26: qty 2

## 2018-04-26 MED ORDER — PROMETHAZINE HCL 25 MG RE SUPP
25.0000 mg | Freq: Four times a day (QID) | RECTAL | Status: DC | PRN
  Filled 2018-04-26: qty 1

## 2018-04-26 MED ORDER — HALOPERIDOL LACTATE 5 MG/ML IJ SOLN: 2.0000 mg | Freq: Once | INTRAMUSCULAR | Status: DC

## 2018-04-26 MED ORDER — ONDANSETRON HCL 4 MG PO TABS: 4.0000 mg | ORAL_TABLET | Freq: Four times a day (QID) | ORAL | Status: DC | PRN

## 2018-04-26 MED ORDER — ENOXAPARIN SODIUM 40 MG/0.4ML ~~LOC~~ SOLN: 40.0000 mg | SUBCUTANEOUS | Status: DC

## 2018-04-26 MED ORDER — PANTOPRAZOLE SODIUM 40 MG IV SOLR
40.0000 mg | Freq: Two times a day (BID) | INTRAVENOUS | Status: DC
  Administered 2018-04-26 – 2018-04-28 (×4): 40 mg via INTRAVENOUS
  Filled 2018-04-26 (×4): qty 40

## 2018-04-26 MED ORDER — DEXTROSE-NACL 5-0.45 % IV SOLN
INTRAVENOUS | Status: DC
  Administered 2018-04-26: 20:00:00 via INTRAVENOUS

## 2018-04-26 MED ORDER — ONDANSETRON HCL 4 MG/2ML IJ SOLN
4.0000 mg | Freq: Four times a day (QID) | INTRAMUSCULAR | Status: DC | PRN
  Administered 2018-04-27 (×2): 4 mg via INTRAVENOUS
  Filled 2018-04-26 (×2): qty 2

## 2018-04-26 MED ORDER — SODIUM CHLORIDE 0.9 % IV BOLUS
1000.0000 mL | Freq: Once | INTRAVENOUS | Status: AC
  Administered 2018-04-26: 1000 mL via INTRAVENOUS

## 2018-04-26 MED ORDER — LORAZEPAM 1 MG PO TABS
1.0000 mg | ORAL_TABLET | Freq: Three times a day (TID) | ORAL | Status: DC | PRN
  Administered 2018-04-27: 1 mg via ORAL
  Filled 2018-04-26: qty 1

## 2018-04-26 MED ORDER — HALOPERIDOL LACTATE 5 MG/ML IJ SOLN
2.0000 mg | Freq: Once | INTRAMUSCULAR | Status: AC
  Administered 2018-04-26: 2 mg via INTRAVENOUS
  Filled 2018-04-26: qty 1

## 2018-04-26 MED ORDER — LORAZEPAM 2 MG/ML IJ SOLN
1.0000 mg | Freq: Once | INTRAMUSCULAR | Status: AC
  Administered 2018-04-26: 1 mg via INTRAVENOUS
  Filled 2018-04-26: qty 1

## 2018-04-26 MED ORDER — DICYCLOMINE HCL 20 MG PO TABS
20.0000 mg | ORAL_TABLET | Freq: Two times a day (BID) | ORAL | Status: DC
  Administered 2018-04-26 – 2018-04-28 (×4): 20 mg via ORAL
  Filled 2018-04-26 (×4): qty 1

## 2018-04-26 NOTE — Progress Notes (Signed)
Pt BP systolic 89 lying down. Pt has continuous fluids infusing intravenously. Pt stable, no acute distress noted. Will retake vitals in one hour. Call light within reach, will continue to monitor.
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
## 2018-04-26 NOTE — ED Provider Notes (Signed)
[facility] Provider Note   CSN: [id number] Arrival date & time: 04/26/18  1429     History   Chief Complaint Chief Complaint  Patient presents with  . Emesis    HPI Shelly Petty is a 22 y.o. female with history of anemia, depression, anxiety presents for evaluation of ongoing and worsening nausea and vomiting for 4 days.  She has been seen and evaluated in the ER on 11/21 and 11/23 for the same without significant improvement.  Was discharged with Phenergan suppository, Bentyl, and Ativan during these visits and notes they have not been helpful.  States that she has greater than 10 episodes of nonbloody nonbilious emesis daily.  Reports intermittent dull upper abdominal pain which does not radiate.  Also notes some aching back pain.  Denies fevers, chest pain but does note chest tightness and shortness of breath which are consistent with her usual anxiety symptoms.  She states she has had 2 episodes previously similar to this in which she had frequent ER visits for IV fluid hydration and antiemetics.  Had an appointment with a PCP today but canceled it due to her symptoms.  Denies vaginal itching, bleeding, discharge, dysuria, hematuria, melena, hematochezia, diarrhea, or constipation.  Occasional smoker, denies recreational drug use or excessive alcohol intake.  Denies recent travel out of the country or suspicious food intake.  Recently on bactrim and Keflex for suspected preseptal cellulitis initiated on 04/20/2018 but has since discontinued.  Reports she has not been on these medications in the past to her knowledge.   The history is provided by the patient.    Past Medical History:  Diagnosis Date  . Anemia   . Chicken pox   . Depression     Patient Active Problem List   Diagnosis Date Noted  . Nausea & vomiting 04/26/2018  . Immune to hepatitis B 08/24/2017  . Anemia 08/20/2017  . History of varicella 08/20/2017  . Fibroadenoma of  both breasts 08/20/2017  . Anxiety state 08/17/2012  . Depression 09/05/2011    Past Surgical History:  Procedure Laterality Date  . NO PAST SURGERIES       OB History   None      Home Medications    Prior to Admission medications   Medication Sig Start Date End Date Taking? Authorizing Provider  dicyclomine (BENTYL) 20 MG tablet Take 1 tablet (20 mg total) by mouth 2 (two) times daily. 04/23/18  Yes McDonald, Mia A, PA-C  LORazepam (ATIVAN) 1 MG tablet Take 1 tablet (1 mg total) by mouth 3 (three) times daily as needed for anxiety (nausea). 04/24/18  Yes Jola Schmidt, MD  promethazine (PHENERGAN) 25 MG suppository Place 1 suppository (25 mg total) rectally every 6 (six) hours as needed for nausea or vomiting. 04/23/18  Yes McDonald, Mia A, PA-C    Family History Family History  Problem Relation Age of Onset  . Mental illness Mother        depression  . Cancer Mother        melanoma  . Diabetes Father   . Hypertension Father   . Hyperlipidemia Father   . Mental illness Sister   . Mental illness Brother   . Drug abuse Brother     Social History Social History   Tobacco Use  . Smoking status: Former Research scientist (life sciences)  . Smokeless tobacco: Never Used  . Tobacco comment: never regular smoking  Substance Use Topics  . Alcohol use: Yes    Frequency:  Never    Comment: once a year, on Christmas, with her mother  . Drug use: Not Currently    Comment: previous marijuana use (last use age 21)     Allergies   Prozac [fluoxetine hcl]; Escitalopram oxalate; and Serotonin   Review of Systems Review of Systems  Constitutional: Negative for chills and fever.  Respiratory: Positive for chest tightness and shortness of breath.   Cardiovascular: Negative for chest pain.  Gastrointestinal: Positive for abdominal pain, nausea and vomiting. Negative for constipation and diarrhea.  Genitourinary: Positive for decreased urine volume. Negative for dysuria, hematuria, vaginal bleeding,  vaginal discharge and vaginal pain.  All other systems reviewed and are negative.    Physical Exam Updated Vital Signs BP (!) 115/97   Pulse 70   Temp 98.4 F (36.9 C) (Oral)   Resp (!) 23   Ht 5\' 3"  (1.6 m)   Wt 63.5 kg   LMP 04/21/2018   SpO2 99%   BMI 24.80 kg/m   Physical Exam  Constitutional: She appears well-developed and well-nourished. No distress.  Appears anxious, fidgeting in bed  HENT:  Head: Normocephalic and atraumatic.  Eyes: Conjunctivae are normal. Right eye exhibits no discharge. Left eye exhibits no discharge.  Neck: No JVD present. No tracheal deviation present.  Cardiovascular: Normal rate, regular rhythm and normal heart sounds.  Pulmonary/Chest: Effort normal.  Abdominal: Soft. Bowel sounds are normal. She exhibits no distension and no mass. There is tenderness in the right upper quadrant, epigastric area and left upper quadrant. There is no rigidity, no rebound, no guarding, no CVA tenderness and negative Murphy's sign.  Mild discomfort to palpation of the upper abdomen  Musculoskeletal: She exhibits no edema.  Neurological: She is alert.  Skin: Skin is warm and dry. No erythema. There is pallor.  Psychiatric: She has a normal mood and affect. Her behavior is normal.  Nursing note and vitals reviewed.    ED Treatments / Results  Labs (all labs ordered are listed, but only abnormal results are displayed) Labs Reviewed  BASIC METABOLIC PANEL - Abnormal; Notable for the following components:      Result Value   CO2 20 (*)    Glucose, Bld 106 (*)    All other components within normal limits  HEPATIC FUNCTION PANEL - Abnormal; Notable for the following components:   Albumin 5.1 (*)    Bilirubin, Direct 0.3 (*)    All other components within normal limits  URINALYSIS, ROUTINE W REFLEX MICROSCOPIC - Abnormal; Notable for the following components:   APPearance HAZY (*)    Hgb urine dipstick MODERATE (*)    Ketones, ur 80 (*)    Bacteria, UA MANY  (*)    All other components within normal limits  LIPASE, BLOOD  CBC  RAPID URINE DRUG SCREEN, HOSP PERFORMED  BASIC METABOLIC PANEL  CBC  POC URINE PREG, ED    EKG None  Radiology No results found.  Procedures Procedures (including critical care time)  Medications Ordered in ED Medications  LORazepam (ATIVAN) injection 1 mg (has no administration in time range)  haloperidol lactate (HALDOL) injection 2 mg (has no administration in time range)  dicyclomine (BENTYL) tablet 20 mg (has no administration in time range)  promethazine (PHENERGAN) suppository 25 mg (has no administration in time range)  LORazepam (ATIVAN) tablet 1 mg (has no administration in time range)  enoxaparin (LOVENOX) injection 40 mg (has no administration in time range)  dextrose 5 %-0.45 % sodium chloride infusion (has no  administration in time range)  ondansetron (ZOFRAN) tablet 4 mg (has no administration in time range)    Or  ondansetron (ZOFRAN) injection 4 mg (has no administration in time range)  sodium chloride 0.9 % bolus 1,000 mL (1,000 mLs Intravenous New Bag/Given 04/26/18 1657)  haloperidol lactate (HALDOL) injection 2 mg (2 mg Intravenous Given 04/26/18 1657)     Initial Impression / Assessment and Plan / ED Course  I have reviewed the triage vital signs and the nursing notes.  Pertinent labs & imaging results that were available during my care of the patient were reviewed by me and considered in my medical decision making (see chart for details).     Patient with third visit to the ER with ongoing nausea and vomiting.  Reports more than 10 episodes daily with no improvement with Phenergan suppository, fentanyl, or Ativan.  She is afebrile, vital signs are stable.  She is extremely anxious in appearance.  We will recheck lab work and give IV fluids.  EKG with normal QT, will give Haldol and reassess.  Lab work reviewed by me shows no leukocytosis, no anemia, no metabolic derangements or  renal insufficiency.  LFTs and lipase unremarkable.  UA suggests dehydration and many bacteria but appears to not be a clean-catch.  She does not have any significant urinary symptoms and I doubt UTI as UA performed 2 days ago did not suggest UTI.  Low suspicion of acute surgical abdominal pathology so we will hold on CT scan for now.  On reassessment patient notes ongoing nausea and is extremely hesitant to attempt p.o. challenge.  Given this is her third visit for similar symptoms and she has not had any GI work-up, feel she would benefit from admission for further evaluation and management of intractable nausea and vomiting.  Spoke with Dr. Steffanie Dunn with [facility] who agrees to assume care of patient and bring her into the hospital for further evaluation and management.  Final Clinical Impressions(s) / ED Diagnoses   Final diagnoses:  Intractable vomiting with nausea, unspecified vomiting type    ED Discharge Orders    None       Renita Papa, PA-C 04/26/18 1900    Daleen Bo, MD 04/27/18 1521

## 2018-04-26 NOTE — ED Triage Notes (Signed)
sttaes has had non stop n/v  Since last Thursday has seen 2 ers and was given rx and got them filled and taking them but cannot stop

## 2018-04-26 NOTE — Progress Notes (Signed)
   04/26/18 1700  Clinical Encounter Type  Visited With Family;Patient not available  Visit Type Initial;ED;Other (Comment) (HCPOA)  Referral From Nurse  Spiritual Encounters  Spiritual Needs Literature  [facility] paged for request for HCPOA literature; [place] provided HCPOA and discussed HCPOA document requirements. Gwynn Burly

## 2018-04-27 ENCOUNTER — Encounter (HOSPITAL_COMMUNITY): Payer: Self-pay | Admitting: General Practice

## 2018-04-27 DIAGNOSIS — R112 Nausea with vomiting, unspecified: Secondary | ICD-10-CM | POA: Diagnosis not present

## 2018-04-27 DIAGNOSIS — R1115 Cyclical vomiting syndrome unrelated to migraine: Secondary | ICD-10-CM | POA: Diagnosis not present

## 2018-04-27 LAB — BASIC METABOLIC PANEL
Anion gap: 5 (ref 5–15)
BUN: <5 mg/dL — ABNORMAL LOW (ref 6–20)
CO2: 23 mmol/L (ref 22–32)
Calcium: 8.3 mg/dL — ABNORMAL LOW (ref 8.9–10.3)
Chloride: 110 mmol/L (ref 98–111)
Creatinine, Ser: 0.6 mg/dL (ref 0.44–1.00)
GFR calc Af Amer: >60 mL/min (ref >60)
GFR calc non Af Amer: >60 mL/min (ref >60)
Glucose, Bld: 84 mg/dL (ref 70–99)
Potassium: 2.8 mmol/L — ABNORMAL LOW (ref 3.5–5.1)
Sodium: 138 mmol/L (ref 135–145)

## 2018-04-27 LAB — CBC
HCT: 32.5 % — ABNORMAL LOW (ref 36.0–46.0)
Hemoglobin: 10.4 g/dL — ABNORMAL LOW (ref 12.0–15.0)
MCH: 29 pg (ref 26.0–34.0)
MCHC: 32 g/dL (ref 30.0–36.0)
MCV: 90.5 fL (ref 80.0–100.0)
Platelets: 183 10*3/uL (ref 150–400)
RBC: 3.59 MIL/uL — ABNORMAL LOW (ref 3.87–5.11)
RDW: 11.7 % (ref 11.5–15.5)
WBC: 6.7 10*3/uL (ref 4.0–10.5)
nRBC: 0 % (ref 0.0–0.2)

## 2018-04-27 LAB — MAGNESIUM: Magnesium: 2 mg/dL (ref 1.7–2.4)

## 2018-04-27 MED ORDER — POTASSIUM CHLORIDE IN NACL 20-0.9 MEQ/L-% IV SOLN
INTRAVENOUS | Status: DC
  Administered 2018-04-27 – 2018-04-28 (×2): via INTRAVENOUS
  Filled 2018-04-27 (×3): qty 1000

## 2018-04-27 MED ORDER — SODIUM CHLORIDE 0.9 % IV BOLUS
500.0000 mL | Freq: Once | INTRAVENOUS | Status: AC
  Administered 2018-04-27: 500 mL via INTRAVENOUS

## 2018-04-27 NOTE — Progress Notes (Addendum)
Progress Note    Shelly Petty  DVV:[id number] DOB: 1996-01-07  DOA: 04/26/2018 PCP: Shawnee Knapp, MD    Brief Narrative:    Medical records reviewed and are as summarized below:  Shelly Petty is an 22 y.o. female with medical history significant for anxiety and depression who presented to the ED today with c/o N/V since Thursday.  Her new primary care doctor's office on November 19 and diagnosed with preseptal cellulitis.  She was given Bactrim and Keflex which patient started taking that day.  She had never taken these antibiotics before.  She thinks that this likely triggered her current episode of nausea and vomiting.  She has been in the ED 3 times in the past week for intractable nausea and vomiting, 11/21, 11/23 and today.  Previous times in the ED this week she has been given IV fluids and IV Ativan.  Right upper quadrant ultrasound has been unremarkable.  She has had ketonuria on urinalysis but otherwise labs have been unremarkable.  She is tried Phenergan suppositories, Bentyl at home and has at times been able to keep down p.o.  Yesterday she had a relatively good day and was able to keep down some food but this morning when she woke up she began vomiting again and has had 10 episodes of emesis today.  She describes it as non-bloody, occasionally bilious.  She has had no diarrhea.  No fever.  Mild constipation this past week. Her partner states that in the past when this has happened if she gets enough sedation to fall asleep her nausea usually resolves but otherwise it has been refractory to other treatments.  She states that she had a 1 to 2-week episode of similar symptoms her senior year in high school and again her sophomore year in college and was told it was gastroenteritis.  She had not had any issues with nausea and vomiting in the past 5 years up until last week.  She reports a "tense," unsettled feeling in her stomach but no actual pain.  She states she smokes occasionally,  denies recreational drug use or excessive alcohol intake.  She denied any recent travel out of the country or suspicious food intake.  No dysuria or hematuria. She states that she has lost about 5 pounds in the last year which is unintentional.  She just established care with a primary care physician.  She has never seen a gastroenterologist.  She has never had endoscopy. Last menstrual period 04/21/2018.  Assessment/Plan:   Active Problems:   Anxiety state   Nausea & vomiting  Cyclical Nausea and vomiting:  - Labwork is unrevealing including lipase. She is not diabetic and therefore gastroparesis unlikely. No fever or diarrhea to suggest gastroenteritis.  -trial of IV protonix 40 mg BID-- she thinks this is helping -cont phenergan per rectum prn -ativan prn -haldol prn - spoke with Dr. Kalman Shan- he agrees with diagnosis of cyclical vomiting:  30 ml clear liquids q 30 min while awake for 24-48 hours then increase to 60 ml and then 120 ml and then low residue (if tolerating 60 ml tomorrow PM can go home and advance diet there)--- will eventually need an EGD but if she improves, this can be done outpatient if not improving may need to be done here-- will need to re-consult  Hypokalemia -replace in IVF -check Mg  Anxiety -cont home meds, ativan. Pt cannot tolerate SSRIs.   Family Communication/Anticipated D/C date and plan/Code Status   DVT prophylaxis: Lovenox  ordered. Code Status: Full Code.  Family Communication: friends at bedside Disposition Plan:    Medical Consultants:    GI (phone)  Subjective:   "having a good day"  Objective:    Vitals:   04/27/18 0043 04/27/18 0556 04/27/18 0658 04/27/18 1129  BP: (!) 87/50 (!) 88/55 98/63 (!) 91/59  Pulse: 60 73 82 69  Resp:  16  16  Temp:  98.3 F (36.8 C)  97.8 F (36.6 C)  TempSrc:  Oral    SpO2:  99%  100%  Weight:      Height:        Intake/Output Summary (Last 24 hours) at 04/27/2018 1231 Last data filed at  04/27/2018 0659 Gross per 24 hour  Intake 1856.25 ml  Output -  Net 1856.25 ml   Filed Weights   04/26/18 1443 04/26/18 1940  Weight: 63.5 kg 62 kg    Exam: In bed, NAD rrr Clear lungs +BS, soft, NT No LE edema Alert and appropriate  Data Reviewed:   I have personally reviewed following labs and imaging studies:  Labs: Labs show the following:   Basic Metabolic Panel: Recent Labs  Lab 04/22/18 1823 04/24/18 1322 04/26/18 1502 04/27/18 0640  NA 139 135 139 138  K 4.5 3.5 4.3 2.8*  CL 104 104 107 110  CO2 21* 20* 20* 23  GLUCOSE 129* 113* 106* 84  BUN 9 10 7  <5*  CREATININE 0.73 0.70 0.76 0.60  CALCIUM 10.2 9.7 9.7 8.3*   GFR Estimated Creatinine Clearance: 89.7 mL/min (by C-G formula based on SCr of 0.6 mg/dL). Liver Function Tests: Recent Labs  Lab 04/22/18 1823 04/24/18 1322 04/26/18 1502  AST 22 25 24   ALT 15 16 18   ALKPHOS 41 41 39  BILITOT 0.8 0.6 1.2  PROT 8.2* 8.1 7.9  ALBUMIN 5.4* 5.3* 5.1*   Recent Labs  Lab 04/22/18 1823 04/24/18 1322 04/26/18 1502  LIPASE 34 39 33   No results for input(s): AMMONIA in the last 168 hours. Coagulation profile No results for input(s): INR, PROTIME in the last 168 hours.  CBC: Recent Labs  Lab 04/22/18 1823 04/24/18 1322 04/26/18 1502 04/27/18 0640  WBC 11.7* 9.5 8.7 6.7  HGB 13.2 12.7 13.4 10.4*  HCT 39.6 38.1 40.7 32.5*  MCV 90.4 90.9 91.9 90.5  PLT 227 246 216 183   Cardiac Enzymes: No results for input(s): CKTOTAL, CKMB, CKMBINDEX, TROPONINI in the last 168 hours. BNP (last 3 results) No results for input(s): PROBNP in the last 8760 hours. CBG: No results for input(s): GLUCAP in the last 168 hours. D-Dimer: No results for input(s): DDIMER in the last 72 hours. Hgb A1c: No results for input(s): HGBA1C in the last 72 hours. Lipid Profile: No results for input(s): CHOL, HDL, LDLCALC, TRIG, CHOLHDL, LDLDIRECT in the last 72 hours. Thyroid function studies: No results for input(s):  TSH, T4TOTAL, T3FREE, THYROIDAB in the last 72 hours.  Invalid input(s): FREET3 Anemia work up: No results for input(s): VITAMINB12, FOLATE, FERRITIN, TIBC, IRON, RETICCTPCT in the last 72 hours. Sepsis Labs: Recent Labs  Lab 04/22/18 1823 04/24/18 1322 04/26/18 1502 04/27/18 0640  WBC 11.7* 9.5 8.7 6.7    Microbiology No results found for this or any previous visit (from the past 240 hour(s)).  Procedures and diagnostic studies:  No results found.  Medications:   . dicyclomine  20 mg Intramuscular Once  . dicyclomine  20 mg Oral BID  . enoxaparin (LOVENOX) injection  40 mg Subcutaneous Q24H  .  haloperidol lactate  2 mg Intravenous Once  . pantoprazole (PROTONIX) IV  40 mg Intravenous Q12H   Continuous Infusions: . 0.9 % NaCl with KCl 20 mEq / L 75 mL/hr at 04/27/18 1216     LOS: 0 days   Geradine Girt  [facility]   *Please refer to [email or web address], password [email or web address] to get updated schedule on who will round on this patient, as hospitalists switch teams weekly. If 7PM-7AM, please contact night-coverage at [email or web address], password [email or web address] for any overnight needs.  04/27/2018, 12:31 PM

## 2018-04-27 NOTE — Progress Notes (Signed)
Pt BP maintains high 60Q systolic. Pt asymptomatic. Provider made aware, orders given.

## 2018-04-28 ENCOUNTER — Encounter (HOSPITAL_COMMUNITY): Payer: Self-pay | Admitting: Internal Medicine

## 2018-04-28 DIAGNOSIS — R1115 Cyclical vomiting syndrome unrelated to migraine: Secondary | ICD-10-CM | POA: Diagnosis present

## 2018-04-28 DIAGNOSIS — R112 Nausea with vomiting, unspecified: Secondary | ICD-10-CM | POA: Diagnosis not present

## 2018-04-28 LAB — BASIC METABOLIC PANEL
Anion gap: 7 (ref 5–15)
CALCIUM: 9.5 mg/dL (ref 8.9–10.3)
CO2: 23 mmol/L (ref 22–32)
Chloride: 110 mmol/L (ref 98–111)
Creatinine, Ser: 0.65 mg/dL (ref 0.44–1.00)
GFR calc Af Amer: >60 mL/min (ref >60)
Glucose, Bld: 92 mg/dL (ref 70–99)
Potassium: 3.3 mmol/L — ABNORMAL LOW (ref 3.5–5.1)
SODIUM: 140 mmol/L (ref 135–145)

## 2018-04-28 LAB — CBC
HCT: 39.1 % (ref 36.0–46.0)
Hemoglobin: 12.7 g/dL (ref 12.0–15.0)
MCH: 29.2 pg (ref 26.0–34.0)
MCHC: 32.5 g/dL (ref 30.0–36.0)
MCV: 89.9 fL (ref 80.0–100.0)
PLATELETS: 166 10*3/uL (ref 150–400)
RBC: 4.35 MIL/uL (ref 3.87–5.11)
RDW: 11.8 % (ref 11.5–15.5)
WBC: 5.3 10*3/uL (ref 4.0–10.5)
nRBC: 0 % (ref 0.0–0.2)

## 2018-04-28 MED ORDER — PANTOPRAZOLE SODIUM 40 MG PO TBEC: 40.0000 mg | DELAYED_RELEASE_TABLET | Freq: Every day | ORAL | Status: DC

## 2018-04-28 MED ORDER — PANTOPRAZOLE SODIUM 40 MG PO TBEC: 40.0000 mg | DELAYED_RELEASE_TABLET | Freq: Every day | ORAL | 0 refills | Status: DC

## 2018-04-28 MED ORDER — ONDANSETRON HCL 4 MG PO TABS: 4.0000 mg | ORAL_TABLET | Freq: Four times a day (QID) | ORAL | 0 refills | Status: DC | PRN

## 2018-04-28 MED ORDER — POTASSIUM CHLORIDE 10 MEQ/100ML IV SOLN
10.0000 meq | INTRAVENOUS | Status: AC
  Administered 2018-04-28 (×5): 10 meq via INTRAVENOUS
  Filled 2018-04-28 (×5): qty 100

## 2018-04-28 NOTE — Discharge Summary (Addendum)
Physician Discharge Summary  Shelly Petty IZT:[id number] DOB: 02-06-1996 DOA: 04/26/2018  PCP: Shelly Knapp, MD  Admit date: 04/26/2018 Discharge date: 04/28/2018  Time spent: 45 minutes  Recommendations for Outpatient Follow-up:  1. Follow up with PCP 1-2 weeks for evaluation of cyclic vomiting, diet regemin. Recommend BMET for evaluation of electrolytes 2. Take medication as directed 3. Follow up with Shelly. Cristina Petty as scheduled   Discharge Diagnoses:  Principal Problem:   Cyclical vomiting Active Problems:   Anxiety state   Nausea & vomiting   Discharge Condition: stable. Tolerating clear liquids at rate of 135ml every 30 minutes while awake. Will advance to low residue as tolerated OP.   Diet recommendation: liquids to low residue as discussed advance as tolerated  Filed Weights   04/26/18 1443 04/26/18 1940  Weight: 63.5 kg 62 kg    History of present illness:  Shelly Petty is a 22 y.o. female with medical history significant for anxiety and depression who presented to the ED on 11/25 with c/o N/V for 4 days.  she saw primary care doctor on November 19 and diagnosed with preseptal cellulitis.  She was given Bactrim and Keflex which patient started taking that day.  She had never taken these antibiotics before.  She believes that this likely triggered her current episode of nausea and vomiting.  She had been in the ED 3 times in the previous week for intractable nausea and vomiting, 11/21, 11/23 and day of admission.  Previous times in the ED  she had been given IV fluids and IV Ativan.  Right upper quadrant ultrasound had been unremarkable.  She has had ketonuria on urinalysis but otherwise labs have been unremarkable.  She is tried Phenergan suppositories, Bentyl at home and has at times been able to keep down p.o.  One day prior to admission she had a relatively good day and was able to keep down some food but on 11/25 when she woke up she began vomiting again and has had 10  episodes of emesis.  She described it as non-bloody, occasionally bilious.  She has   Hospital Course:   Cyclical Nausea and vomiting:  - Labwork  unrevealing including lipase. She is not diabetic and therefore gastroparesis unlikely. No fever or diarrhea to suggest gastroenteritis. UDA negative. RUQ [place] within limits of normal.  -provided IV protonix 40 mg BID and improved somewhat.  -phenergan per rectum prn,-ativan prn and haldol prn -Admitting physician spoke Shelly Petty- he agreed with diagnosis of cyclical vomiting: Recommended 30 ml clear liquids q 30 min while awake for 24-48 hours then increase to 60 ml and then 120 ml and then low residue (if tolerating 60 ml 04/28/18 PM can go home and advance diet there)--- will eventually need an EGD as an outpatient.  Hypokalemia: secondary to GI losses -replaced in IVF. On day of discharge received 5 runs potassium  Mg level 2.0  Anxiety -cont home meds, ativan. Pt cannot tolerate SSRIs.  Procedures:    Consultations:  Shelly Petty  Discharge Exam: Vitals:   04/28/18 0549 04/28/18 0934  BP: (!) 103/56 106/63  Pulse: (!) 56 84  Resp: 16 17  Temp: 97.9 F (36.6 C) 97.6 F (36.4 C)  SpO2: 99% 98%    General: well nourished sitting on side of bed no acute distress Cardiovascular: RRR no mgr no LE edema Respiratory: normal effort BS clear bilaterally now wheeze no rhonchi Abdomen: flat soft +BS non tender to palpation. No guarding or rebounding  Discharge Instructions  Eat 120 ml clear liquids every 30 minutes while awake  and then low residue --- will eventually need an EGD but this can be done outpatient. Allergies as of 04/28/2018      Reactions   Prozac [fluoxetine Hcl] Nausea And Vomiting   Escitalopram Oxalate Nausea And Vomiting   Pt does not do well with SSRI medications   Serotonin Nausea And Vomiting      Medication List    TAKE these medications   dicyclomine 20 MG tablet Commonly known as:  BENTYL Take 1  tablet (20 mg total) by mouth 2 (two) times daily.   LORazepam 1 MG tablet Commonly known as:  ATIVAN Take 1 tablet (1 mg total) by mouth 3 (three) times daily as needed for anxiety (nausea).   ondansetron 4 MG tablet Commonly known as:  ZOFRAN Take 1 tablet (4 mg total) by mouth every 6 (six) hours as needed for nausea.   pantoprazole 40 MG tablet Commonly known as:  PROTONIX Take 1 tablet (40 mg total) by mouth daily. Start taking on:  04/29/2018   promethazine 25 MG suppository Commonly known as:  PHENERGAN Place 1 suppository (25 mg total) rectally every 6 (six) hours as needed for nausea or vomiting.      Allergies  Allergen Reactions  . Prozac [Fluoxetine Hcl] Nausea And Vomiting  . Escitalopram Oxalate Nausea And Vomiting    Pt does not do well with SSRI medications  . Serotonin Nausea And Vomiting   Follow-up Information    Petty, Robert, MD Follow up in 2 week(s).   Specialty:  Gastroenterology Why:  Call to make and appointment Contact information: [address] [place] [address] [phone]            The results of significant diagnostics from this hospitalization (including imaging, microbiology, ancillary and laboratory) are listed below for reference.    Significant Diagnostic Studies: US Abdomen Limited Ruq  Result Date: 04/22/2018 CLINICAL DATA:  Nausea and vomiting with back pain x1 day. EXAM: ULTRASOUND ABDOMEN LIMITED RIGHT UPPER QUADRANT COMPARISON:  None. FINDINGS: Gallbladder: No gallstones or wall thickening visualized. No sonographic Murphy sign noted by sonographer. Common bile duct: Diameter: 1.8 mm Liver: No focal lesion identified. Within normal limits in parenchymal echogenicity. Portal vein is patent on color Doppler imaging with normal direction of blood flow towards the liver. IMPRESSION: Normal right upper quadrant abdominal ultrasound. Electronically Signed   By: Shelly Petty M.D.   On: 04/22/2018 21:17     Microbiology: No results found for this or any previous visit (from the past 240 hour(s)).   Labs: Basic Metabolic Panel: Recent Labs  Lab 04/22/18 1823 04/24/18 1322 04/26/18 1502 04/27/18 0640 04/28/18 0714  NA 139 135 139 138 140  K 4.5 3.5 4.3 2.8* 3.3*  CL 104 104 107 110 110  CO2 21* 20* 20* 23 23  GLUCOSE 129* 113* 106* 84 92  BUN 9 10 7  <5* <5*  CREATININE 0.73 0.70 0.76 0.60 0.65  CALCIUM 10.2 9.7 9.7 8.3* 9.5  MG  --   --   --  2.0  --    Liver Function Tests: Recent Labs  Lab 04/22/18 1823 04/24/18 1322 04/26/18 1502  AST 22 25 24   ALT 15 16 18   ALKPHOS 41 41 39  BILITOT 0.8 0.6 1.2  PROT 8.2* 8.1 7.9  ALBUMIN 5.4* 5.3* 5.1*   Recent Labs  Lab 04/22/18 1823 04/24/18 1322 04/26/18 1502  LIPASE 34 39 33  No results for input(s): AMMONIA in the last 168 hours. CBC: Recent Labs  Lab 04/22/18 1823 04/24/18 1322 04/26/18 1502 04/27/18 0640 04/28/18 0714  WBC 11.7* 9.5 8.7 6.7 5.3  HGB 13.2 12.7 13.4 10.4* 12.7  HCT 39.6 38.1 40.7 32.5* 39.1  MCV 90.4 90.9 91.9 90.5 89.9  PLT 227 246 216 183 166   Cardiac Enzymes: No results for input(s): CKTOTAL, CKMB, CKMBINDEX, TROPONINI in the last 168 hours. BNP: BNP (last 3 results) No results for input(s): BNP in the last 8760 hours.  ProBNP (last 3 results) No results for input(s): PROBNP in the last 8760 hours.  CBG: No results for input(s): GLUCAP in the last 168 hours.

## 2018-04-28 NOTE — Discharge Instructions (Signed)
Drink 17ml every 30 minutes while awake as well as low residue diet Eat small amounts at first and increase amounts slowly as tolerated Once tolerating moderate amounts of low residue introduce regular diet

## 2018-05-06 ENCOUNTER — Ambulatory Visit: Payer: 59 | Admitting: Family Medicine

## 2018-05-06 ENCOUNTER — Other Ambulatory Visit: Payer: Self-pay

## 2018-05-06 ENCOUNTER — Encounter: Payer: Self-pay | Admitting: Family Medicine

## 2018-05-06 VITALS — BP 106/68 | HR 111 | Temp 98.5°F | Resp 14 | Ht 63.0 in | Wt 141.2 lb

## 2018-05-06 DIAGNOSIS — K580 Irritable bowel syndrome with diarrhea: Secondary | ICD-10-CM | POA: Diagnosis not present

## 2018-05-06 DIAGNOSIS — N63 Unspecified lump in unspecified breast: Secondary | ICD-10-CM

## 2018-05-06 DIAGNOSIS — F411 Generalized anxiety disorder: Secondary | ICD-10-CM | POA: Diagnosis not present

## 2018-05-06 DIAGNOSIS — R1115 Cyclical vomiting syndrome unrelated to migraine: Secondary | ICD-10-CM | POA: Diagnosis not present

## 2018-05-06 DIAGNOSIS — E876 Hypokalemia: Secondary | ICD-10-CM | POA: Diagnosis not present

## 2018-05-06 LAB — POCT URINALYSIS DIP (MANUAL ENTRY)
Bilirubin, UA: NEGATIVE
Blood, UA: NEGATIVE
Glucose, UA: NEGATIVE mg/dL
Ketones, POC UA: NEGATIVE mg/dL
Leukocytes, UA: NEGATIVE
Nitrite, UA: NEGATIVE
PH UA: 7 (ref 5.0–8.0)
Protein Ur, POC: NEGATIVE mg/dL
Spec Grav, UA: 1.02 (ref 1.010–1.025)
Urobilinogen, UA: 0.2 E.U./dL

## 2018-05-06 LAB — POC MICROSCOPIC URINALYSIS (UMFC): Mucus: ABSENT

## 2018-05-06 MED ORDER — PANTOPRAZOLE SODIUM 40 MG PO TBEC: 40.0000 mg | DELAYED_RELEASE_TABLET | Freq: Every day | ORAL | 0 refills | Status: DC

## 2018-05-06 MED ORDER — HYDROXYZINE HCL 10 MG PO TABS: 10.0000 mg | ORAL_TABLET | ORAL | 0 refills | Status: DC | PRN

## 2018-05-06 NOTE — Patient Instructions (Addendum)
Try GUT HEALING TEA: 1/2 tsp cumin seed 1/2 tsp coriander seed 1/2 tsp fennel seed  Boil seeds in 5-6 cupts of water for 10 minutes. Strain out seeds. Drink warm or room temp. Refrigerate leftover, but reheat to drink - do not drink cold. Or put seeds in a coffee filter and ball them up in a mesh tea-ball to avoid need for straining.    If you have lab work done today you will be contacted with your lab results within the next 2 weeks.  If you have not heard from [place] then please contact us. The fastest way to get your results is to register for My Chart.   IF you received an x-ray today, you will receive an invoice from [facility]. Please contact [facility] at [phone] with questions or concerns regarding your invoice.   IF you received labwork today, you will receive an invoice from [facility]. Please contact [facility] at [phone] with questions or concerns regarding your invoice.   Our billing staff will not be able to assist you with questions regarding bills from these companies.  You will be contacted with the lab results as soon as they are available. The fastest way to get your results is to activate your My Chart account. Instructions are located on the last page of this paperwork. If you have not heard from [place] regarding the results in 2 weeks, please contact this office.     Diet for Irritable Bowel Syndrome When you have irritable bowel syndrome (IBS), the foods you eat and your eating habits are very important. IBS may cause various symptoms, such as abdominal pain, constipation, or diarrhea. Choosing the right foods can help ease discomfort caused by these symptoms. Work with your health care provider and dietitian to find the best eating plan to help control your symptoms. What general guidelines do I need to follow?  Keep a food diary. This will help you identify foods that cause symptoms. Write down: ? What you eat and when. ? What symptoms you  have. ? When symptoms occur in relation to your meals.  Avoid foods that cause symptoms. Talk with your dietitian about other ways to get the same nutrients that are in these foods.  Eat more foods that contain fiber. Take a fiber supplement if directed by your dietitian.  Eat your meals slowly, in a relaxed setting.  Aim to eat 5-6 small meals per day. Do not skip meals.  Drink enough fluids to keep your urine clear or pale yellow.  Ask your health care provider if you should take an over-the-counter probiotic during flare-ups to help restore healthy gut bacteria.  If you have cramping or diarrhea, try making your meals low in fat and high in carbohydrates. Examples of carbohydrates are pasta, rice, whole grain breads and cereals, fruits, and vegetables.  If dairy products cause your symptoms to flare up, try eating less of them. You might be able to handle yogurt better than other dairy products because it contains bacteria that help with digestion. What foods are not recommended? The following are some foods and drinks that may worsen your symptoms:  Fatty foods, such as [place] fries.  Milk products, such as cheese or ice cream.  Chocolate.  Alcohol.  Products with caffeine, such as coffee.  Carbonated drinks, such as soda.  The items listed above may not be a complete list of foods and beverages to avoid. Contact your dietitian for more information. What foods are good sources of fiber?  Your health care provider or dietitian may recommend that you eat more foods that contain fiber. Fiber can help reduce constipation and other IBS symptoms. Add foods with fiber to your diet a little at a time so that your body can get used to them. Too much fiber at once might cause gas and swelling of your abdomen. The following are some foods that are good sources of fiber:  Apples.  Peaches.  Pears.  Berries.  Figs.  Broccoli (raw).  Cabbage.  Carrots.  Raw peas.  Kidney  beans.  Lima beans.  Whole grain bread.  Whole grain cereal.  Where to find more information: [employer] for Functional Gastrointestinal Disorders: www.iffgd.[employer] of Diabetes and Digestive and Kidney Diseases: NetworkAffair.co.za.aspx This information is not intended to replace advice given to you by your health care provider. Make sure you discuss any questions you have with your health care provider. Document Released: 08/09/2003 Document Revised: 10/25/2015 Document Reviewed: 08/19/2013 Elsevier Interactive Patient Education  2018 [employer].

## 2018-05-06 NOTE — Progress Notes (Signed)
Subjective:    Patient: Shelly Griner "[place]"  DOB: 1996-04-21; 22 y.o.   MRN: [id number]  Chief Complaint  Patient presents with  . Establish Care    follow up from ER visit(04/26/2018)  Dx with Sickular vomiting syndrom and have questions    HPI  Non-binary - they/them pronouns Was d/c'd from the hospital last Wednesday. Has had 2 other times in her life with uncontrollable vomiting - so anxious their legs were moving uncontrollably - the other 2 times she was on SSRIs - at 22 yo was admitted to psych hosp for a week at that short time stay and then during the hospitalization the first one occurred.  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 In talk therapy and had - seeing Alicia Amel - and working well This time she was started on Keflex and Bactrim she took the evening dose, the whole day of the next day, then on the following day she started projectile vomiting and was occurring every other day for the next week - required 2 ER visits and then the following day she would be fine, then on the 3rd time she was admitted. Was d/c'd on a low residue diet and then slowly adding more back in.   Has a poor digestive system do to stress - IBS-D style. Has not seen GI doctor - but has info to see Dr. Cristina Gong.  Has had fibroadenoma in both breasts. One of them has gotten slightly larger, the size of the walnut - sometimes painful. Has been thinking about top surgery but not sure if she should consider.  Consider repeating imaging  Works for [employer] History Past Medical History:  Diagnosis Date  . Anemia   . Chicken pox   . Cyclical vomiting   . Depression   . Family history of adverse reaction to anesthesia    sister had a hard time    Past Surgical History:  Procedure Laterality Date  . NO PAST SURGERIES     Current Outpatient Medications on File Prior to Visit  Medication Sig Dispense Refill    . dicyclomine (BENTYL) 20 MG tablet Take 1 tablet (20 mg total) by mouth 2 (two) times daily. 20 tablet 0  . LORazepam (ATIVAN) 1 MG tablet Take 1 tablet (1 mg total) by mouth 3 (three) times daily as needed for anxiety (nausea). 8 tablet 0  . ondansetron (ZOFRAN) 4 MG tablet Take 1 tablet (4 mg total) by mouth every 6 (six) hours as needed for nausea. 20 tablet 0  . pantoprazole (PROTONIX) 40 MG tablet Take 1 tablet (40 mg total) by mouth daily. 30 tablet 0  . promethazine (PHENERGAN) 25 MG suppository Place 1 suppository (25 mg total) rectally every 6 (six) hours as needed for nausea or vomiting. 12 each 0   No current facility-administered medications on file prior to visit.    Allergies  Allergen Reactions  . Prozac [Fluoxetine Hcl] Nausea And Vomiting  . Escitalopram Oxalate Nausea And Vomiting    Pt does not do well with SSRI medications  . Serotonin Nausea And Vomiting   Family History  Problem Relation Age of Onset  . Mental illness Mother        depression  . Cancer Mother        melanoma  . Diabetes Father   . Hypertension Father   . Hyperlipidemia Father   . Mental  illness Sister   . Mental illness Brother   . Drug abuse Brother    Social History   Socioeconomic History  . Marital status: Single    Spouse name: n/a  . Number of children: 0  . Years of education: Not on file  . Highest education level: Bachelor's degree (e.g., BA, AB, BS)  Occupational History  . Occupation: Licensed conveyancer    Comment: Public house manager  Social Needs  . Financial resource strain: Not very hard  . Food insecurity:    Worry: Never true    Inability: Never true  . Transportation needs:    Medical: No    Non-medical: No  Tobacco Use  . Smoking status: Former Research scientist (life sciences)  . Smokeless tobacco: Never Used  . Tobacco comment: never regular smoking  Substance and Sexual Activity  . Alcohol use: Yes    Frequency: Never    Comment: once a year, on Christmas, with her mother  . Drug  use: Not Currently    Comment: previous marijuana use (last use age 18)  . Sexual activity: Yes    Birth control/protection: Condom, Other-see comments    Comment: dental dam, pre-intimacy testing of partners  Lifestyle  . Physical activity:    Days per week: 1 day    Minutes per session: 60 min  . Stress: To some extent  Relationships  . Social connections:    Talks on phone: Three times a week    Gets together: Twice a week    Attends religious service: Never    Active member of club or organization: Yes    Attends meetings of clubs or organizations: More than 4 times per year    Relationship status: Never married  Other Topics Concern  . Not on file  Social History Narrative   Lives with her cats, named Mom and Dad.   Originally from [place], [place], where her mother and siblings still live.   Father lives in [place], near [place].   Came to [facility] for school fall 2013.   Depression screen [facility] 2/9 05/06/2018 04/20/2018 08/20/2017  Decreased Interest 0 0 0  Down, Depressed, Hopeless 0 0 0  PHQ - 2 Score 0 0 0    ROS As noted in HPI  Objective:  BP 106/68 (BP Location: Right Arm, Patient Position: Sitting, Cuff Size: Large)   Pulse (!) 111   Temp 98.5 F (36.9 C) (Oral)   Resp 14   Ht 5\' 3"  (1.6 m)   Wt 141 lb 3.2 oz (64 kg)   LMP 04/21/2018   SpO2 97%   BMI 25.01 kg/m  Physical Exam Constitutional:      General: She is not in acute distress.    Appearance: She is well-developed. She is not diaphoretic.  HENT:     Head: Normocephalic and atraumatic.     Right Ear: External ear normal.     Left Ear: External ear normal.  Eyes:     General: No scleral icterus.    Conjunctiva/sclera: Conjunctivae normal.  Neck:     Musculoskeletal: Normal range of motion and neck supple.     Thyroid: No thyromegaly.  Cardiovascular:     Rate and Rhythm: Normal rate and regular rhythm.     Heart sounds: Normal heart sounds.  Pulmonary:     Effort: Pulmonary effort is  normal. No respiratory distress.     Breath sounds: Normal breath sounds.  Chest:     Breasts:  Right: Mass present.        Left: Mass present.  Lymphadenopathy:     Cervical: No cervical adenopathy.  Skin:    General: Skin is warm and dry.     Findings: No erythema.  Neurological:     Mental Status: She is alert and oriented to person, place, and time.  Psychiatric:        Behavior: Behavior normal.     [facility] Office Visit on 05/06/2018  Component Date Value Ref Range Status  . Glucose 05/06/2018 86  65 - 99 mg/dL Final  . BUN 05/06/2018 8  6 - 20 mg/dL Final  . Creatinine, Ser 05/06/2018 0.53* 0.57 - 1.00 mg/dL Final  . GFR calc non Af Amer 05/06/2018 133  >59 mL/min/1.73 Final  . GFR calc Af Amer 05/06/2018 154  >59 mL/min/1.73 Final  . BUN/Creatinine Ratio 05/06/2018 15  9 - 23 Final  . Sodium 05/06/2018 141  134 - 144 mmol/L Final  . Potassium 05/06/2018 4.1  3.5 - 5.2 mmol/L Final  . Chloride 05/06/2018 103  96 - 106 mmol/L Final  . CO2 05/06/2018 24  20 - 29 mmol/L Final  . Calcium 05/06/2018 9.5  8.7 - 10.2 mg/dL Final  . Total Protein 05/06/2018 7.2  6.0 - 8.5 g/dL Final  . Albumin 05/06/2018 4.9  3.5 - 5.5 g/dL Final  . Globulin, Total 05/06/2018 2.3  1.5 - 4.5 g/dL Final  . Albumin/Globulin Ratio 05/06/2018 2.1  1.2 - 2.2 Final  . Bilirubin Total 05/06/2018 <0.2  0.0 - 1.2 mg/dL Final  . Alkaline Phosphatase 05/06/2018 54  39 - 117 IU/L Final  . AST 05/06/2018 16  0 - 40 IU/L Final  . ALT 05/06/2018 22  0 - 32 IU/L Final  . WBC 05/06/2018 6.4  3.4 - 10.8 x10E3/uL Final  . RBC 05/06/2018 3.91  3.77 - 5.28 x10E6/uL Final  . Hemoglobin 05/06/2018 11.7  11.1 - 15.9 g/dL Final  . Hematocrit 05/06/2018 35.7  34.0 - 46.6 % Final  . MCV 05/06/2018 91  79 - 97 fL Final  . MCH 05/06/2018 29.9  26.6 - 33.0 pg Final  . MCHC 05/06/2018 32.8  31.5 - 35.7 g/dL Final  . RDW 05/06/2018 12.0* 12.3 - 15.4 % Final  . Platelets 05/06/2018 200  150 - 450 x10E3/uL Final   . Neutrophils 05/06/2018 59  Not Estab. % Final  . Lymphs 05/06/2018 32  Not Estab. % Final  . Monocytes 05/06/2018 8  Not Estab. % Final  . Eos 05/06/2018 1  Not Estab. % Final  . Basos 05/06/2018 0  Not Estab. % Final  . Neutrophils Absolute 05/06/2018 3.8  1.4 - 7.0 x10E3/uL Final  . Lymphocytes Absolute 05/06/2018 2.0  0.7 - 3.1 x10E3/uL Final  . Monocytes Absolute 05/06/2018 0.5  0.1 - 0.9 x10E3/uL Final  . EOS (ABSOLUTE) 05/06/2018 0.1  0.0 - 0.4 x10E3/uL Final  . Basophils Absolute 05/06/2018 0.0  0.0 - 0.2 x10E3/uL Final  . Immature Granulocytes 05/06/2018 0  Not Estab. % Final  . Immature Grans (Abs) 05/06/2018 0.0  0.0 - 0.1 x10E3/uL Final  . Color, UA 05/06/2018 yellow  yellow Final  . Clarity, UA 05/06/2018 clear  clear Final  . Glucose, UA 05/06/2018 negative  negative mg/dL Final  . Bilirubin, UA 05/06/2018 negative  negative Final  . Ketones, POC UA 05/06/2018 negative  negative mg/dL Final  . Spec Grav, UA 05/06/2018 1.020  1.010 - 1.025 Final  . Blood,  UA 05/06/2018 negative  negative Final  . pH, UA 05/06/2018 7.0  5.0 - 8.0 Final  . Protein Ur, POC 05/06/2018 negative  negative mg/dL Final  . Urobilinogen, UA 05/06/2018 0.2  0.2 or 1.0 E.U./dL Final  . Nitrite, UA 05/06/2018 Negative  Negative Final  . Leukocytes, UA 05/06/2018 Negative  Negative Final  . WBC,UR,HPF,POC 05/06/2018 None  None WBC/hpf Final  . RBC,UR,HPF,POC 05/06/2018 None  None RBC/hpf Final  . Bacteria 05/06/2018 None  None, Too numerous to count Final  . Mucus 05/06/2018 Absent  Absent Final  . Epithelial Cells, UR Per Microscopy 05/06/2018 None  None, Too numerous to count cells/hpf Final     Assessment & Plan:   1. Breast mass in female   2. Irritable bowel syndrome with diarrhea   3. Anxiety state   4. Cyclical vomiting syndrome   5. Hypokalemia     Above  Patient will continue on current chronic medications other than changes noted above, so ok to refill when needed.   See after  visit summary for patient specific instructions.  Orders Placed This Encounter  Procedures  . Comprehensive metabolic panel  . CBC with Differential/Platelet  . Ambulatory referral to Gastroenterology    Referral Priority:   Routine    Referral Type:   Consultation    Referral Reason:   Specialty Services Required    Number of Visits Requested:   1  . POCT urinalysis dipstick  . POCT Microscopic Urinalysis ([facility])    Meds ordered this encounter  Medications  . pantoprazole (PROTONIX) 40 MG tablet    Sig: Take 1 tablet (40 mg total) by mouth daily.    Dispense:  30 tablet    Refill:  0  . hydrOXYzine (ATARAX/VISTARIL) 10 MG tablet    Sig: Take 1-2 tablets (10-20 mg total) by mouth every 4 (four) hours as needed for anxiety.    Dispense:  60 tablet    Refill:  0    Patient verbalized to me that they understand the following: diagnosis, what is being done for them, what to expect and what should be done at home.  Their questions have been answered. They understand that I am unable to predict every possible medication interaction or adverse outcome and that if any unexpected symptoms arise, they should contact us and their pharmacist, as well as never hesitate to seek urgent/emergent care at [facility] or ER if they think it might be warranted.    Over 40 min spent in face-to-face evaluation of and consultation with patient and coordination of care.  Over 50% of this time was spent counseling this patient regarding above.   Delman Cheadle, MD, MPH [facility] [address] [place], [place]  [phone] Office phone  [phone] Office fax  05/06/18 3:35 PM

## 2018-05-07 ENCOUNTER — Ambulatory Visit
Admission: RE | Admit: 2018-05-07 | Discharge: 2018-05-07 | Disposition: A | Discharge status: Home / Self Care | Payer: 59 | Source: Ambulatory Visit | Attending: Family Medicine | Admitting: Family Medicine

## 2018-05-07 ENCOUNTER — Other Ambulatory Visit: Payer: Self-pay | Admitting: Family Medicine

## 2018-05-07 DIAGNOSIS — N6021 Fibroadenosis of right breast: Secondary | ICD-10-CM

## 2018-05-07 DIAGNOSIS — D241 Benign neoplasm of right breast: Secondary | ICD-10-CM

## 2018-05-07 DIAGNOSIS — N6310 Unspecified lump in the right breast, unspecified quadrant: Secondary | ICD-10-CM | POA: Diagnosis not present

## 2018-05-07 DIAGNOSIS — D242 Benign neoplasm of left breast: Secondary | ICD-10-CM

## 2018-05-07 DIAGNOSIS — N6022 Fibroadenosis of left breast: Principal | ICD-10-CM

## 2018-05-07 DIAGNOSIS — N6320 Unspecified lump in the left breast, unspecified quadrant: Secondary | ICD-10-CM | POA: Diagnosis not present

## 2018-05-07 LAB — CBC WITH DIFFERENTIAL/PLATELET
Basophils Absolute: 0 10*3/uL (ref 0.0–0.2)
Basos: 0 %
EOS (ABSOLUTE): 0.1 10*3/uL (ref 0.0–0.4)
Eos: 1 %
Hematocrit: 35.7 % (ref 34.0–46.6)
Hemoglobin: 11.7 g/dL (ref 11.1–15.9)
Immature Grans (Abs): 0 10*3/uL (ref 0.0–0.1)
Immature Granulocytes: 0 %
Lymphocytes Absolute: 2 10*3/uL (ref 0.7–3.1)
Lymphs: 32 %
MCH: 29.9 pg (ref 26.6–33.0)
MCHC: 32.8 g/dL (ref 31.5–35.7)
MCV: 91 fL (ref 79–97)
Monocytes Absolute: 0.5 10*3/uL (ref 0.1–0.9)
Monocytes: 8 %
Neutrophils Absolute: 3.8 10*3/uL (ref 1.4–7.0)
Neutrophils: 59 %
Platelets: 200 10*3/uL (ref 150–450)
RBC: 3.91 x10E6/uL (ref 3.77–5.28)
RDW: 12 % — ABNORMAL LOW (ref 12.3–15.4)
WBC: 6.4 10*3/uL (ref 3.4–10.8)

## 2018-05-07 LAB — COMPREHENSIVE METABOLIC PANEL
ALT: 22 IU/L (ref 0–32)
AST: 16 IU/L (ref 0–40)
Albumin/Globulin Ratio: 2.1 (ref 1.2–2.2)
Albumin: 4.9 g/dL (ref 3.5–5.5)
Alkaline Phosphatase: 54 IU/L (ref 39–117)
BUN/Creatinine Ratio: 15 (ref 9–23)
BUN: 8 mg/dL (ref 6–20)
Bilirubin Total: <0.2 mg/dL (ref 0.0–1.2)
CALCIUM: 9.5 mg/dL (ref 8.7–10.2)
CO2: 24 mmol/L (ref 20–29)
Chloride: 103 mmol/L (ref 96–106)
Creatinine, Ser: 0.53 mg/dL — ABNORMAL LOW (ref 0.57–1.00)
GFR calc Af Amer: 154 mL/min/{1.73_m2} (ref >59)
GFR calc non Af Amer: 133 mL/min/{1.73_m2} (ref >59)
Globulin, Total: 2.3 g/dL (ref 1.5–4.5)
Glucose: 86 mg/dL (ref 65–99)
Potassium: 4.1 mmol/L (ref 3.5–5.2)
Sodium: 141 mmol/L (ref 134–144)
Total Protein: 7.2 g/dL (ref 6.0–8.5)

## 2018-05-14 ENCOUNTER — Other Ambulatory Visit: Payer: 59

## 2018-05-14 ENCOUNTER — Ambulatory Visit: Payer: 59 | Admitting: Gastroenterology

## 2018-05-14 ENCOUNTER — Encounter: Payer: Self-pay | Admitting: Gastroenterology

## 2018-05-14 VITALS — BP 100/58 | HR 68 | Ht 63.25 in | Wt 146.5 lb

## 2018-05-14 DIAGNOSIS — R112 Nausea with vomiting, unspecified: Secondary | ICD-10-CM

## 2018-05-14 NOTE — Patient Instructions (Signed)
Use a daily probiotic such as Align daily.   Your provider has requested that you go to the basement level for lab work before leaving today. Press "B" on the elevator. The lab is located at the first door on the left as you exit the elevator.
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
## 2018-05-16 ENCOUNTER — Encounter: Payer: Self-pay | Admitting: Gastroenterology

## 2018-05-17 ENCOUNTER — Other Ambulatory Visit: Payer: 59

## 2018-05-17 DIAGNOSIS — R112 Nausea with vomiting, unspecified: Secondary | ICD-10-CM | POA: Diagnosis not present

## 2018-05-18 ENCOUNTER — Encounter: Payer: Self-pay | Admitting: Family Medicine

## 2018-05-18 LAB — HELICOBACTER PYLORI  SPECIAL ANTIGEN
MICRO NUMBER: 91502181
SPECIMEN QUALITY: ADEQUATE

## 2018-05-19 ENCOUNTER — Telehealth: Payer: Self-pay | Admitting: *Deleted

## 2018-05-19 NOTE — Telephone Encounter (Signed)
Dr. Gunnar Fusi has an appointment in January but needed a form filled out, I put it in your box but we do not have the labs needed to finish it.  Can we do a lab only visit even though it is a month before her appointment.       Thank You

## 2018-05-20 ENCOUNTER — Telehealth: Payer: Self-pay | Admitting: *Deleted

## 2018-05-20 ENCOUNTER — Other Ambulatory Visit: Payer: Self-pay | Admitting: *Deleted

## 2018-05-20 DIAGNOSIS — Z0000 Encounter for general adult medical examination without abnormal findings: Secondary | ICD-10-CM

## 2018-05-20 NOTE — Telephone Encounter (Signed)
Spoke with patient she is opting out of having blood work.  Stated she is not interested in having anymore blood work done.  Advised we couldn't fill form out without information. Patient voiced understanding.

## 2018-05-20 NOTE — Telephone Encounter (Signed)
Sure that is fine - please enter future orders for these - can just connect to "screening for . . . "

## 2018-05-20 NOTE — Telephone Encounter (Signed)
Patient says she isn't going to come in has enough blood work and does not need form

## 2018-06-09 ENCOUNTER — Telehealth: Payer: Self-pay | Admitting: Family Medicine

## 2018-06-09 NOTE — Telephone Encounter (Signed)
LVM for patient to reschedule her appointment on 06/12/18 with Dr Brigitte Pulse, due to Dr Brigitte Pulse being out on leave. Patient will need to make her CPE appointment in late Feb or early in March with Dr Brigitte Pulse or she can come in sooner and see a different provider if she would like.

## 2018-06-12 ENCOUNTER — Encounter: Payer: 59 | Admitting: Family Medicine

## 2018-06-21 ENCOUNTER — Telehealth: Payer: Self-pay | Admitting: Family Medicine

## 2018-06-21 NOTE — Telephone Encounter (Signed)
MyChart message sent to pt about their appointment on 07/05/18 with Dr Brigitte Pulse.

## 2018-07-05 ENCOUNTER — Encounter: Payer: 59 | Admitting: Family Medicine

## 2018-08-19 ENCOUNTER — Telehealth: Payer: Self-pay | Admitting: Family Medicine

## 2018-08-19 ENCOUNTER — Encounter (HOSPITAL_BASED_OUTPATIENT_CLINIC_OR_DEPARTMENT_OTHER): Payer: Self-pay | Admitting: *Deleted

## 2018-08-19 ENCOUNTER — Other Ambulatory Visit: Payer: Self-pay | Admitting: Family Medicine

## 2018-08-19 ENCOUNTER — Observation Stay (HOSPITAL_BASED_OUTPATIENT_CLINIC_OR_DEPARTMENT_OTHER)
Admission: EM | Admit: 2018-08-19 | Discharge: 2018-08-20 | Disposition: A | Discharge status: Home / Self Care | Payer: 59 | Source: Home / Self Care | Attending: Emergency Medicine | Admitting: Emergency Medicine

## 2018-08-19 ENCOUNTER — Other Ambulatory Visit: Payer: Self-pay

## 2018-08-19 DIAGNOSIS — Z79899 Other long term (current) drug therapy: Secondary | ICD-10-CM

## 2018-08-19 DIAGNOSIS — E876 Hypokalemia: Principal | ICD-10-CM | POA: Diagnosis present

## 2018-08-19 DIAGNOSIS — E860 Dehydration: Secondary | ICD-10-CM | POA: Diagnosis present

## 2018-08-19 DIAGNOSIS — K219 Gastro-esophageal reflux disease without esophagitis: Secondary | ICD-10-CM | POA: Diagnosis not present

## 2018-08-19 DIAGNOSIS — Z813 Family history of other psychoactive substance abuse and dependence: Secondary | ICD-10-CM | POA: Diagnosis not present

## 2018-08-19 DIAGNOSIS — D649 Anemia, unspecified: Secondary | ICD-10-CM | POA: Diagnosis not present

## 2018-08-19 DIAGNOSIS — Z833 Family history of diabetes mellitus: Secondary | ICD-10-CM | POA: Diagnosis not present

## 2018-08-19 DIAGNOSIS — Z87891 Personal history of nicotine dependence: Secondary | ICD-10-CM | POA: Insufficient documentation

## 2018-08-19 DIAGNOSIS — F329 Major depressive disorder, single episode, unspecified: Secondary | ICD-10-CM | POA: Diagnosis present

## 2018-08-19 DIAGNOSIS — I959 Hypotension, unspecified: Secondary | ICD-10-CM | POA: Diagnosis present

## 2018-08-19 DIAGNOSIS — K589 Irritable bowel syndrome without diarrhea: Secondary | ICD-10-CM | POA: Diagnosis present

## 2018-08-19 DIAGNOSIS — Z818 Family history of other mental and behavioral disorders: Secondary | ICD-10-CM

## 2018-08-19 DIAGNOSIS — Z8349 Family history of other endocrine, nutritional and metabolic diseases: Secondary | ICD-10-CM | POA: Diagnosis not present

## 2018-08-19 DIAGNOSIS — Z808 Family history of malignant neoplasm of other organs or systems: Secondary | ICD-10-CM

## 2018-08-19 DIAGNOSIS — Z888 Allergy status to other drugs, medicaments and biological substances status: Secondary | ICD-10-CM

## 2018-08-19 DIAGNOSIS — R1115 Cyclical vomiting syndrome unrelated to migraine: Secondary | ICD-10-CM | POA: Diagnosis present

## 2018-08-19 DIAGNOSIS — D62 Acute posthemorrhagic anemia: Secondary | ICD-10-CM | POA: Diagnosis present

## 2018-08-19 DIAGNOSIS — R112 Nausea with vomiting, unspecified: Secondary | ICD-10-CM | POA: Insufficient documentation

## 2018-08-19 DIAGNOSIS — Z8249 Family history of ischemic heart disease and other diseases of the circulatory system: Secondary | ICD-10-CM

## 2018-08-19 DIAGNOSIS — F411 Generalized anxiety disorder: Secondary | ICD-10-CM | POA: Diagnosis present

## 2018-08-19 DIAGNOSIS — N390 Urinary tract infection, site not specified: Secondary | ICD-10-CM | POA: Diagnosis present

## 2018-08-19 DIAGNOSIS — R8271 Bacteriuria: Secondary | ICD-10-CM | POA: Diagnosis present

## 2018-08-19 LAB — COMPREHENSIVE METABOLIC PANEL
ALT: 15 U/L (ref 0–44)
AST: 23 U/L (ref 15–41)
Albumin: 5.4 g/dL — ABNORMAL HIGH (ref 3.5–5.0)
Alkaline Phosphatase: 47 U/L (ref 38–126)
Anion gap: 11 (ref 5–15)
BUN: 6 mg/dL (ref 6–20)
CO2: 22 mmol/L (ref 22–32)
Calcium: 9.7 mg/dL (ref 8.9–10.3)
Chloride: 104 mmol/L (ref 98–111)
Creatinine, Ser: 0.59 mg/dL (ref 0.44–1.00)
GFR calc Af Amer: >60 mL/min (ref >60)
GFR calc non Af Amer: >60 mL/min (ref >60)
Glucose, Bld: 101 mg/dL — ABNORMAL HIGH (ref 70–99)
POTASSIUM: 3.6 mmol/L (ref 3.5–5.1)
SODIUM: 137 mmol/L (ref 135–145)
Total Bilirubin: 0.7 mg/dL (ref 0.3–1.2)
Total Protein: 8.4 g/dL — ABNORMAL HIGH (ref 6.5–8.1)

## 2018-08-19 LAB — CBC WITH DIFFERENTIAL/PLATELET
Abs Immature Granulocytes: 0.04 10*3/uL (ref 0.00–0.07)
BASOS PCT: 0 %
Basophils Absolute: 0 10*3/uL (ref 0.0–0.1)
Eosinophils Absolute: 0 10*3/uL (ref 0.0–0.5)
Eosinophils Relative: 0 %
HCT: 41.8 % (ref 36.0–46.0)
Hemoglobin: 13.6 g/dL (ref 12.0–15.0)
Immature Granulocytes: 0 %
Lymphocytes Relative: 14 %
Lymphs Abs: 1.7 10*3/uL (ref 0.7–4.0)
MCH: 30.1 pg (ref 26.0–34.0)
MCHC: 32.5 g/dL (ref 30.0–36.0)
MCV: 92.5 fL (ref 80.0–100.0)
Monocytes Absolute: 0.6 10*3/uL (ref 0.1–1.0)
Monocytes Relative: 5 %
Neutro Abs: 10.1 10*3/uL — ABNORMAL HIGH (ref 1.7–7.7)
Neutrophils Relative %: 81 %
PLATELETS: 248 10*3/uL (ref 150–400)
RBC: 4.52 MIL/uL (ref 3.87–5.11)
RDW: 11.6 % (ref 11.5–15.5)
WBC: 12.4 10*3/uL — ABNORMAL HIGH (ref 4.0–10.5)
nRBC: 0 % (ref 0.0–0.2)

## 2018-08-19 LAB — URINALYSIS, ROUTINE W REFLEX MICROSCOPIC
Bilirubin Urine: NEGATIVE
GLUCOSE, UA: NEGATIVE mg/dL
Ketones, ur: >80 mg/dL — AB
Nitrite: POSITIVE — AB
Protein, ur: 100 mg/dL — AB
Specific Gravity, Urine: 1.02 (ref 1.005–1.030)
pH: 7.5 (ref 5.0–8.0)

## 2018-08-19 LAB — URINALYSIS, MICROSCOPIC (REFLEX): RBC / HPF: >50 RBC/hpf (ref 0–5)

## 2018-08-19 LAB — PREGNANCY, URINE: Preg Test, Ur: NEGATIVE

## 2018-08-19 LAB — LIPASE, BLOOD: Lipase: 32 U/L (ref 11–51)

## 2018-08-19 MED ORDER — PANTOPRAZOLE SODIUM 40 MG IV SOLR
40.0000 mg | Freq: Once | INTRAVENOUS | Status: AC
  Administered 2018-08-19: 40 mg via INTRAVENOUS
  Filled 2018-08-19: qty 40

## 2018-08-19 MED ORDER — PROMETHAZINE HCL 25 MG/ML IJ SOLN
INTRAMUSCULAR | Status: AC
  Filled 2018-08-19: qty 1

## 2018-08-19 MED ORDER — LORAZEPAM 2 MG/ML IJ SOLN
1.0000 mg | Freq: Once | INTRAMUSCULAR | Status: AC
  Administered 2018-08-19: 1 mg via INTRAVENOUS
  Filled 2018-08-19: qty 1

## 2018-08-19 MED ORDER — PROMETHAZINE HCL 25 MG/ML IJ SOLN
12.5000 mg | Freq: Once | INTRAMUSCULAR | Status: AC
  Administered 2018-08-19: 12.5 mg via INTRAVENOUS

## 2018-08-19 MED ORDER — SODIUM CHLORIDE 0.9 % IV BOLUS
1000.0000 mL | Freq: Once | INTRAVENOUS | Status: AC
  Administered 2018-08-19: 1000 mL via INTRAVENOUS

## 2018-08-19 MED ORDER — SODIUM CHLORIDE 0.9 % IV SOLN
1.0000 g | Freq: Once | INTRAVENOUS | Status: AC
  Administered 2018-08-19: 1 g via INTRAVENOUS
  Filled 2018-08-19: qty 10

## 2018-08-19 MED ORDER — METOCLOPRAMIDE HCL 5 MG/ML IJ SOLN
10.0000 mg | Freq: Once | INTRAMUSCULAR | Status: AC
  Administered 2018-08-19: 10 mg via INTRAVENOUS
  Filled 2018-08-19: qty 2

## 2018-08-19 NOTE — ED Notes (Signed)
Pt vomited small amount of yellow emesis on herself. Assisted pt with gown change and new blankets given. Pt has emesis bag with in her reach.

## 2018-08-19 NOTE — ED Notes (Signed)
Pt tolerated sips of gingerale. No emesis for 1 hour

## 2018-08-19 NOTE — ED Notes (Signed)
Pt up to BR with assist. Urinated and sample sent.

## 2018-08-19 NOTE — Telephone Encounter (Signed)
Copied from [place] [id number]. Topic: Quick Communication - Rx Refill/Question >> Aug 19, 2018  2:01 PM Gustavus Messing wrote: Medication: LORazepam (ATIVAN) 1 MG tablet [[id number]]  dicyclomine (BENTYL) 20 MG tablet [[id number]]  pantoprazole (PROTONIX) 40 MG tablet [[id number]] hydrOXYzine (ATARAX/VISTARIL) 10 MG tablet [[id number]]   Has the patient contacted their pharmacy? No. (Agent: If no, request that the patient contact the pharmacy for the refill.) Patient was with Dr. Brigitte Pulse and she needs all of her medication refilled   Preferred Pharmacy (with phone number or street name): [id number] [address], [place] [phone] (Phone) [phone] (Fax)    Agent: Please be advised that RX refills may take up to 3 business days. We ask that you follow-up with your pharmacy.

## 2018-08-19 NOTE — ED Triage Notes (Signed)
Vomiting since this am.

## 2018-08-19 NOTE — ED Notes (Signed)
Report given to [place].

## 2018-08-19 NOTE — ED Notes (Signed)
Pt actively vomiting.

## 2018-08-19 NOTE — ED Notes (Signed)
Pt with dry heaves. Provider aware. No po fluids given.

## 2018-08-19 NOTE — ED Provider Notes (Signed)
[facility] Provider Note   CSN: [id number] Arrival date & time: 08/19/18  1607    History   Chief Complaint Chief Complaint  Patient presents with  . Emesis    HPI Shelly Petty is a 23 y.o. female with a hx of anemia, anxiety, depression, IBS, and cyclic vomiting syndrome who presents to the ER with complaints of N/V that began this AM. Patient reports TNTC episodes of emesis throughout the day, she states that she has had 5 episodes where she vomits multiple times until it is only dry heaving. Emesis is non bloody non bilious. She notes this feels similar to prior cyclic vomiting syndrome, feels it was triggered by nausea related to her typical menstrual cramps that came w/ her period this morning as well as anxiety about everything going on in the world. She tried rectal suppository phenergan, bentayl, and ativan earlier this Am around 10AM with some relief- was able to sleep but then awoke vomiting again. No other alleviating/aggravating factors.  Denies fever, chills, hematemesis, abdominal pain other than mild aching/cramps she attributes to menses, diarrhea, blood in her stool, dysuria, recent travel, or recent abx. Denies illicit substance use. She is not concerned for STD.      HPI  Past Medical History:  Diagnosis Date  . Anemia   . Anxiety   . Chicken pox   . Cyclical vomiting   . Cyclical vomiting syndrome   . Depression   . Family history of adverse reaction to anesthesia    sister had a hard time   . IBS (irritable bowel syndrome)     Patient Active Problem List   Diagnosis Date Noted  . Cyclical vomiting   . Nausea & vomiting 04/26/2018  . Immune to hepatitis B 08/24/2017  . Anemia 08/20/2017  . History of varicella 08/20/2017  . Fibroadenoma of both breasts 08/20/2017  . Anxiety state 08/17/2012  . Depression 09/05/2011    Past Surgical History:  Procedure Laterality Date  . NO PAST SURGERIES       OB History   No  obstetric history on file.      Home Medications    Prior to Admission medications   Medication Sig Start Date End Date Taking? Authorizing Provider  dicyclomine (BENTYL) 20 MG tablet Take 20 mg by mouth 2 (two) times daily.   Yes [provider]  hydrOXYzine (ATARAX/VISTARIL) 10 MG tablet Take 1-2 tablets (10-20 mg total) by mouth every 4 (four) hours as needed for anxiety. 05/06/18  Yes Shawnee Knapp, MD  LORazepam (ATIVAN) 1 MG tablet Take 1 mg by mouth 3 (three) times daily as needed for anxiety.   Yes [provider]  ondansetron (ZOFRAN) 4 MG tablet Take 4 mg by mouth every 6 (six) hours as needed for nausea or vomiting.   Yes [provider]  promethazine (PHENERGAN) 25 MG tablet Take 25 mg by mouth every 6 (six) hours as needed for nausea or vomiting.   Yes [provider]  pantoprazole (PROTONIX) 40 MG tablet Take 1 tablet (40 mg total) by mouth daily. 05/06/18 06/05/18  Shawnee Knapp, MD    Family History Family History  Problem Relation Age of Onset  . Mental illness Mother        depression  . Melanoma Mother   . Diabetes Father   . Hypertension Father   . Hyperlipidemia Father   . Mental illness Sister   . Mental illness Brother   . Drug  abuse Brother     Social History Social History   Tobacco Use  . Smoking status: Former Smoker    Types: Cigarettes  . Smokeless tobacco: Never Used  . Tobacco comment: never regular smoking  Substance Use Topics  . Alcohol use: Yes    Frequency: Never    Comment: once a year, on Christmas, with her mother  . Drug use: Not Currently    Comment: previous marijuana use (last use age 55)     Allergies   Prozac [fluoxetine hcl]; Lexapro [escitalopram oxalate]; and Serotonin   Review of Systems Review of Systems  Constitutional: Negative for chills and fever.  Respiratory: Negative for cough and shortness of breath.   Cardiovascular: Negative for chest pain.  Gastrointestinal: Positive for  nausea and vomiting. Negative for abdominal pain, blood in stool, constipation and diarrhea.  Genitourinary: Positive for vaginal bleeding (menses). Negative for dysuria and vaginal discharge.  Psychiatric/Behavioral: Negative for suicidal ideas. The patient is nervous/anxious.   All other systems reviewed and are negative.  Physical Exam Updated Vital Signs BP 110/70   Pulse 85   Temp 99.2 F (37.3 C) (Oral)   Resp 18   Ht 5\' 3"  (1.6 m)   Wt 62.1 kg   LMP 08/19/2018   SpO2 100%   BMI 24.25 kg/m   Physical Exam Vitals signs and nursing note reviewed.  Constitutional:      General: She is not in acute distress.    Appearance: She is well-developed. She is not toxic-appearing.  HENT:     Head: Normocephalic and atraumatic.     Mouth/Throat:     Mouth: Mucous membranes are dry.  Eyes:     General:        Right eye: No discharge.        Left eye: No discharge.     Conjunctiva/sclera: Conjunctivae normal.  Neck:     Musculoskeletal: Neck supple.  Cardiovascular:     Rate and Rhythm: Normal rate and regular rhythm.  Pulmonary:     Effort: Pulmonary effort is normal. No respiratory distress.     Breath sounds: Normal breath sounds. No wheezing, rhonchi or rales.  Abdominal:     General: There is no distension.     Palpations: Abdomen is soft.     Tenderness: There is no abdominal tenderness. There is no right CVA tenderness, left CVA tenderness, guarding or rebound.  Genitourinary:    Comments: Patient declined.  Skin:    General: Skin is warm and dry.     Findings: No rash.  Neurological:     Mental Status: She is alert.     Comments: Clear speech.   Psychiatric:        Mood and Affect: Mood is anxious.      ED Treatments / Results  Labs (all labs ordered are listed, but only abnormal results are displayed) Labs Reviewed  CBC WITH DIFFERENTIAL/PLATELET - Abnormal; Notable for the following components:      Result Value   WBC 12.4 (*)    Neutro Abs 10.1 (*)     All other components within normal limits  COMPREHENSIVE METABOLIC PANEL - Abnormal; Notable for the following components:   Glucose, Bld 101 (*)    Total Protein 8.4 (*)    Albumin 5.4 (*)    All other components within normal limits  URINALYSIS, ROUTINE W REFLEX MICROSCOPIC - Abnormal; Notable for the following components:   Color, Urine RED (*)    APPearance TURBID (*)  Hgb urine dipstick LARGE (*)    Ketones, ur >80 (*)    Protein, ur 100 (*)    Nitrite POSITIVE (*)    Leukocytes,Ua TRACE (*)    All other components within normal limits  URINE CULTURE  LIPASE, BLOOD  PREGNANCY, URINE  URINALYSIS, MICROSCOPIC (REFLEX)    EKG EKG Interpretation  Date/Time:  Thursday August 19 2018 16:52:44 EDT Ventricular Rate:  90 PR Interval:    QRS Duration: 85 QT Interval:  383 QTC Calculation: 469 R Axis:   82 Text Interpretation:  Sinus rhythm since last tracing no significant change Confirmed by Malvin Johns [id number]) on 08/19/2018 6:41:25 PM   Radiology No results found.  Procedures Procedures (including critical care time)  Medications Ordered in ED Medications  cefTRIAXone (ROCEPHIN) 1 g in sodium chloride 0.9 % 100 mL IVPB (1 g Intravenous New Bag/Given 08/19/18 2036)  sodium chloride 0.9 % bolus 1,000 mL ( Intravenous Stopped 08/19/18 1733)  LORazepam (ATIVAN) injection 1 mg (1 mg Intravenous Given 08/19/18 1645)  pantoprazole (PROTONIX) injection 40 mg (40 mg Intravenous Given 08/19/18 1646)  promethazine (PHENERGAN) injection 12.5 mg (12.5 mg Intravenous Given 08/19/18 1718)  sodium chloride 0.9 % bolus 1,000 mL ( Intravenous Stopped 08/19/18 1906)  metoCLOPramide (REGLAN) injection 10 mg (10 mg Intravenous Given 08/19/18 1804)  LORazepam (ATIVAN) injection 1 mg (1 mg Intravenous Given 08/19/18 1804)     Initial Impression / Assessment and Plan / ED Course  I have reviewed the triage vital signs and the nursing notes.  Pertinent labs & imaging results that were  available during my care of the patient were reviewed by me and considered in my medical decision making (see chart for details).   Patient w/ hx of cyclic vomiting syndrome presents to the ER w/ N/V in setting of menstrual cramps & increased anxiety.  Nontoxic-appearing, no apparent distress, vitals WNL.  On exam patient's mucous membranes are a bit dry.  Abdomen is nontender without peritoneal signs.  Will proceed with labs, EKG to check QTC, and fluids with antiemetics.  Work-up reviewed:  CBC: Nonspecific leukocytosis.  No anemia. CMP: No significant abnormalities.  LFTs and renal function are preserved. Lipase: Within normal limits. Pregnancy test: Negative Urinalysis: Consistent with menses, however she is nitrite positive with trace leuks, possibly UTI, will culture & give rocephin -rediscussed concern STDs, patient continues to deny concern, she declined pelvic exam, doubt PID.  EKG: No significant change from prior.   Patient has continued to have nausea and vomiting in the emergency department.  She remains w/ nontender abdomen, no peritoneal signs- doubt cholecystitis, appendicitis, obstruction/perf, diverticulitis, or pancreatitis. She has received multiple doses of antiemetics.  She states she does not feel any better than her initial arrival.  At this time feel admission for intractable nausea and vomiting is appropriate.  Consult placed to hospitalist.  Patient in agreement.  Findings and plan of care discussed with supervising physician Dr. Tamera Punt who is in agreement.   21:00: CONSULT: Discussed case with hospitalist Dr. Marlowe Sax- accepts admission, requesting add on UDS which has been ordered.   Final Clinical Impressions(s) / ED Diagnoses   Final diagnoses:  Intractable nausea and vomiting    ED Discharge Orders    None       Amaryllis Dyke, PA-C 08/19/18 2120    Malvin Johns, MD 08/19/18 2150

## 2018-08-19 NOTE — ED Notes (Signed)
Pt states unable to give urine sample at this time 

## 2018-08-19 NOTE — ED Notes (Signed)
Pt dry heaving  

## 2018-08-19 NOTE — ED Notes (Signed)
Lab called states urine sample was insufficient to run UDS, will attempt to collect another sample.

## 2018-08-20 DIAGNOSIS — R112 Nausea with vomiting, unspecified: Secondary | ICD-10-CM

## 2018-08-20 DIAGNOSIS — R8271 Bacteriuria: Secondary | ICD-10-CM | POA: Diagnosis present

## 2018-08-20 DIAGNOSIS — D62 Acute posthemorrhagic anemia: Secondary | ICD-10-CM

## 2018-08-20 DIAGNOSIS — N390 Urinary tract infection, site not specified: Secondary | ICD-10-CM | POA: Diagnosis present

## 2018-08-20 DIAGNOSIS — F411 Generalized anxiety disorder: Secondary | ICD-10-CM

## 2018-08-20 LAB — CBC
HCT: 35.1 % — ABNORMAL LOW (ref 36.0–46.0)
Hemoglobin: 11.3 g/dL — ABNORMAL LOW (ref 12.0–15.0)
MCH: 30.1 pg (ref 26.0–34.0)
MCHC: 32.2 g/dL (ref 30.0–36.0)
MCV: 93.4 fL (ref 80.0–100.0)
Platelets: 215 10*3/uL (ref 150–400)
RBC: 3.76 MIL/uL — AB (ref 3.87–5.11)
RDW: 11.7 % (ref 11.5–15.5)
WBC: 10.6 10*3/uL — ABNORMAL HIGH (ref 4.0–10.5)
nRBC: 0 % (ref 0.0–0.2)

## 2018-08-20 LAB — BASIC METABOLIC PANEL
Anion gap: 11 (ref 5–15)
BUN: 6 mg/dL (ref 6–20)
CHLORIDE: 108 mmol/L (ref 98–111)
CO2: 17 mmol/L — ABNORMAL LOW (ref 22–32)
CREATININE: 0.52 mg/dL (ref 0.44–1.00)
Calcium: 8.8 mg/dL — ABNORMAL LOW (ref 8.9–10.3)
GFR calc Af Amer: >60 mL/min (ref >60)
GFR calc non Af Amer: >60 mL/min (ref >60)
Glucose, Bld: 103 mg/dL — ABNORMAL HIGH (ref 70–99)
POTASSIUM: 3.7 mmol/L (ref 3.5–5.1)
Sodium: 136 mmol/L (ref 135–145)

## 2018-08-20 LAB — MAGNESIUM: Magnesium: 1.9 mg/dL (ref 1.7–2.4)

## 2018-08-20 MED ORDER — AMOXICILLIN 500 MG PO TABS: 500.0000 mg | ORAL_TABLET | Freq: Three times a day (TID) | ORAL | 0 refills | Status: DC

## 2018-08-20 MED ORDER — PROMETHAZINE HCL 25 MG/ML IJ SOLN: 25.0000 mg | Freq: Four times a day (QID) | INTRAMUSCULAR | Status: DC | PRN

## 2018-08-20 MED ORDER — SODIUM CHLORIDE 0.9 % IV SOLN
INTRAVENOUS | Status: DC
  Administered 2018-08-20: 12:00:00 via INTRAVENOUS

## 2018-08-20 MED ORDER — PANTOPRAZOLE SODIUM 40 MG PO TBEC: 40.0000 mg | DELAYED_RELEASE_TABLET | Freq: Every day | ORAL | 0 refills | Status: DC

## 2018-08-20 MED ORDER — LORAZEPAM 1 MG PO TABS: 1.0000 mg | ORAL_TABLET | Freq: Three times a day (TID) | ORAL | 0 refills | Status: AC | PRN

## 2018-08-20 MED ORDER — METOCLOPRAMIDE HCL 5 MG/ML IJ SOLN: 10.0000 mg | Freq: Four times a day (QID) | INTRAMUSCULAR | Status: DC | PRN

## 2018-08-20 MED ORDER — SODIUM CHLORIDE 0.9 % IV BOLUS
1000.0000 mL | Freq: Once | INTRAVENOUS | Status: AC
  Administered 2018-08-20: 1000 mL via INTRAVENOUS

## 2018-08-20 MED ORDER — ACETAMINOPHEN 325 MG PO TABS: 650.0000 mg | ORAL_TABLET | Freq: Four times a day (QID) | ORAL | Status: AC | PRN

## 2018-08-20 MED ORDER — SODIUM CHLORIDE 0.9 % IV SOLN
INTRAVENOUS | Status: DC
  Administered 2018-08-20: 01:00:00 via INTRAVENOUS

## 2018-08-20 MED ORDER — LORAZEPAM 1 MG PO TABS: 1.0000 mg | ORAL_TABLET | Freq: Three times a day (TID) | ORAL | Status: DC | PRN

## 2018-08-20 MED ORDER — ACETAMINOPHEN 650 MG RE SUPP: 650.0000 mg | Freq: Four times a day (QID) | RECTAL | Status: DC | PRN

## 2018-08-20 MED ORDER — SODIUM CHLORIDE 0.9 % IV SOLN: 1.0000 g | INTRAVENOUS | Status: DC

## 2018-08-20 MED ORDER — ENOXAPARIN SODIUM 40 MG/0.4ML ~~LOC~~ SOLN: 40.0000 mg | SUBCUTANEOUS | Status: DC

## 2018-08-20 MED ORDER — PROCHLORPERAZINE EDISYLATE 10 MG/2ML IJ SOLN: 10.0000 mg | Freq: Four times a day (QID) | INTRAMUSCULAR | Status: DC | PRN

## 2018-08-20 MED ORDER — PANTOPRAZOLE SODIUM 40 MG PO TBEC: 40.0000 mg | DELAYED_RELEASE_TABLET | Freq: Every day | ORAL | 1 refills | Status: AC

## 2018-08-20 MED ORDER — ZOLPIDEM TARTRATE 5 MG PO TABS
5.0000 mg | ORAL_TABLET | Freq: Once | ORAL | Status: AC
  Administered 2018-08-20: 5 mg via ORAL
  Filled 2018-08-20: qty 1

## 2018-08-20 MED ORDER — ACETAMINOPHEN 325 MG PO TABS: 650.0000 mg | ORAL_TABLET | Freq: Four times a day (QID) | ORAL | Status: DC | PRN

## 2018-08-20 MED ORDER — SODIUM CHLORIDE 0.9 % IV SOLN
1.0000 g | INTRAVENOUS | Status: DC
  Administered 2018-08-20: 1 g via INTRAVENOUS
  Filled 2018-08-20: qty 1
  Filled 2018-08-20: qty 10

## 2018-08-20 MED ORDER — PANTOPRAZOLE SODIUM 40 MG IV SOLR
40.0000 mg | Freq: Two times a day (BID) | INTRAVENOUS | Status: DC
  Administered 2018-08-20: 40 mg via INTRAVENOUS
  Filled 2018-08-20: qty 40

## 2018-08-20 MED ORDER — PROMETHAZINE HCL 25 MG PO TABS: 25.0000 mg | ORAL_TABLET | Freq: Four times a day (QID) | ORAL | 0 refills | Status: DC | PRN

## 2018-08-20 MED ORDER — HYDROXYZINE HCL 10 MG PO TABS: 10.0000 mg | ORAL_TABLET | ORAL | 0 refills | Status: AC | PRN

## 2018-08-20 NOTE — H&P (Addendum)
History and Physical    Shelly Petty FYB:[id number] DOB: 04/10/1996 DOA: 08/19/2018  PCP: Shawnee Knapp, MD Patient coming from: Home  Chief Complaint: Nausea and vomiting  HPI: Shelly Petty is a 23 y.o. female with medical history significant of cyclic vomiting syndrome, IBS, depression, anxiety presenting to the hospital for evaluation of nausea and vomiting. Patient states her menstrual cycle started yesterday morning and since then she has been having nausea and vomiting.  She had some lower abdominal cramps initially when her menstrual cycle started but is currently feeling comfortable.  Denies having any abdominal pain.  Denies having any diarrhea.  States her body temperature increases whenever she vomits.  States vomiting is a chronic problem for her and in the past episodes have not been associated with menstrual cycles.  States she has never been given an explanation for her symptoms.  States she saw GI since her previous hospitalization but has never had EGD done.  Denies having any dysuria, urinary frequency, or urgency.  Denies any drug use.  Reports feeling anxious and is requesting Ativan.  Review of Systems: As per HPI otherwise 10 point review of systems negative.  Past Medical History:  Diagnosis Date  . Anemia   . Anxiety   . Chicken pox   . Cyclical vomiting   . Cyclical vomiting syndrome   . Depression   . Family history of adverse reaction to anesthesia    sister had a hard time   . IBS (irritable bowel syndrome)     Past Surgical History:  Procedure Laterality Date  . NO PAST SURGERIES       reports that she has quit smoking. Her smoking use included cigarettes. She has never used smokeless tobacco. She reports current alcohol use. She reports previous drug use.  Allergies  Allergen Reactions  . Prozac [Fluoxetine Hcl] Nausea And Vomiting  . Lexapro [Escitalopram Oxalate] Nausea And Vomiting    Pt does not do well with SSRI medications  . Serotonin Nausea  And Vomiting    Family History  Problem Relation Age of Onset  . Mental illness Mother        depression  . Melanoma Mother   . Diabetes Father   . Hypertension Father   . Hyperlipidemia Father   . Mental illness Sister   . Mental illness Brother   . Drug abuse Brother     Prior to Admission medications   Medication Sig Start Date End Date Taking? Authorizing Provider  dicyclomine (BENTYL) 20 MG tablet Take 20 mg by mouth 2 (two) times daily.   Yes [provider]  hydrOXYzine (ATARAX/VISTARIL) 10 MG tablet Take 1-2 tablets (10-20 mg total) by mouth every 4 (four) hours as needed for anxiety. 05/06/18  Yes Shawnee Knapp, MD  LORazepam (ATIVAN) 1 MG tablet Take 1 mg by mouth 3 (three) times daily as needed for anxiety.   Yes [provider]  ondansetron (ZOFRAN) 4 MG tablet Take 4 mg by mouth every 6 (six) hours as needed for nausea or vomiting.   Yes [provider]  promethazine (PHENERGAN) 25 MG tablet Take 25 mg by mouth every 6 (six) hours as needed for nausea or vomiting.   Yes [provider]  pantoprazole (PROTONIX) 40 MG tablet Take 1 tablet (40 mg total) by mouth daily. 05/06/18 06/05/18  Shawnee Knapp, MD    Physical Exam: Vitals:   08/19/18 1735 08/19/18 1918 08/19/18 2154 08/19/18 2244  BP: 107/63 112/76 112/78 (!) 105/58  Pulse: 80 78 88 73  Resp: 11 16 16 12   Temp:  98.2 F (36.8 C) 98.2 F (36.8 C) 99.9 F (37.7 C)  TempSrc:  Oral Oral Oral  SpO2: 100% 100% 100% 100%  Weight:    62.2 kg  Height:    5\' 3"  (1.6 m)    Physical Exam  Constitutional: She is oriented to person, place, and time. She appears well-developed and well-nourished. No distress.  Having hiccups  HENT:  Head: Normocephalic.  Mouth/Throat: Oropharynx is clear and moist.  Eyes: Right eye exhibits no discharge. Left eye exhibits no discharge.  Neck: Neck supple.  Cardiovascular: Normal rate, regular rhythm and intact distal pulses.  Pulmonary/Chest: Effort  normal and breath sounds normal. No respiratory distress. She has no wheezes. She has no rales.  Abdominal: Soft. Bowel sounds are normal. She exhibits no distension. There is no abdominal tenderness. There is no rebound and no guarding.  Musculoskeletal:        General: No edema.  Neurological: She is alert and oriented to person, place, and time.  Skin: Skin is warm and dry. She is not diaphoretic.     Labs on Admission: I have personally reviewed following labs and imaging studies  CBC: Recent Labs  Lab 08/19/18 1633  WBC 12.4*  NEUTROABS 10.1*  HGB 13.6  HCT 41.8  MCV 92.5  PLT 703   Basic Metabolic Panel: Recent Labs  Lab 08/19/18 1633  NA 137  K 3.6  CL 104  CO2 22  GLUCOSE 101*  BUN 6  CREATININE 0.59  CALCIUM 9.7   GFR: Estimated Creatinine Clearance: 89.7 mL/min (by C-G formula based on SCr of 0.59 mg/dL). Liver Function Tests: Recent Labs  Lab 08/19/18 1633  AST 23  ALT 15  ALKPHOS 47  BILITOT 0.7  PROT 8.4*  ALBUMIN 5.4*   Recent Labs  Lab 08/19/18 1633  LIPASE 32   No results for input(s): AMMONIA in the last 168 hours. Coagulation Profile: No results for input(s): INR, PROTIME in the last 168 hours. Cardiac Enzymes: No results for input(s): CKTOTAL, CKMB, CKMBINDEX, TROPONINI in the last 168 hours. BNP (last 3 results) No results for input(s): PROBNP in the last 8760 hours. HbA1C: No results for input(s): HGBA1C in the last 72 hours. CBG: No results for input(s): GLUCAP in the last 168 hours. Lipid Profile: No results for input(s): CHOL, HDL, LDLCALC, TRIG, CHOLHDL, LDLDIRECT in the last 72 hours. Thyroid Function Tests: No results for input(s): TSH, T4TOTAL, FREET4, T3FREE, THYROIDAB in the last 72 hours. Anemia Panel: No results for input(s): VITAMINB12, FOLATE, FERRITIN, TIBC, IRON, RETICCTPCT in the last 72 hours. Urine analysis:    Component Value Date/Time   COLORURINE RED (A) 08/19/2018 1633   APPEARANCEUR TURBID (A)  08/19/2018 1633   LABSPEC 1.020 08/19/2018 1633   PHURINE 7.5 08/19/2018 1633   GLUCOSEU NEGATIVE 08/19/2018 1633   HGBUR LARGE (A) 08/19/2018 1633   BILIRUBINUR NEGATIVE 08/19/2018 1633   BILIRUBINUR negative 05/06/2018 1658   KETONESUR >80 (A) 08/19/2018 1633   PROTEINUR 100 (A) 08/19/2018 1633   UROBILINOGEN 0.2 05/06/2018 1658   NITRITE POSITIVE (A) 08/19/2018 1633   LEUKOCYTESUR TRACE (A) 08/19/2018 1633    Radiological Exams on Admission: No results found.  EKG: Independently reviewed.  Sinus rhythm, repolarization abnormality.  No significant change since prior tracing.  Assessment/Plan Principal Problem:   Intractable nausea and vomiting Active Problems:   Anxiety state   Cyclical vomiting   Asymptomatic bacteriuria  Intractable nausea and vomiting; history of recurrent nausea and vomiting -Patient is presenting with 1 day history of intractable nausea and vomiting in the setting of current menstrual cycle. -Afebrile.  White count mildly elevated at 12.4.  Lipase and LFTs normal.  Abdominal exam benign. -Received Protonix, Phenergan, and Reglan in the ED but continued to vomit. -Gastroparesis less likely as patient does not have diabetes.  No fever or diarrhea to suggest gastroenteritis. -Last hospitalization for similar symptoms was in November 2019.  Right upper quadrant ultrasound done at that time was normal.  Admitting physician had consulted GI at that time and recommendation was to get an outpatient EGD; not done yet.  Patient was then seen by outpatient GI in December 2019.  Her symptoms were thought to be due to abdominal migraines versus anxiety.  Recommendation was to improve control of her anxiety to prevent future episodes. -Check UDS -IV Phenergan PRN -Reglan PRN -IV fluid hydration -IV Protonix 40 mg every 12 hours -Continue home Ativan 1 mg PRN anxiety -Continue to monitor CBC -Consult GI in the morning  Asymptomatic bacteriuria -Patient denies any  UTI symptoms.  UA with large amount of hemoglobin (in the setting of current menstrual cycle), positive nitrite, and trace leukocytes.  Afebrile.  White count mildly elevated at 12.4. -Patient received a dose of ceftriaxone in the ED. -Urine microscopic examination and urine culture pending. -Continue to monitor CBC  Anxiety -Continue home Ativan 1 mg as needed for anxiety  Unable to safely order remainder of home medications at this time as pharmacy medication reconciliation is pending.  DVT prophylaxis: Lovenox Code Status: Full code Family Communication: No family available Disposition Plan: Anticipate discharge after clinical improvement Consults called: None Admission status: Observation, MedSurg  This chart was dictated using voice recognition software.  Despite best efforts to proofread, errors can occur which can change the documentation meaning.  Shela Leff MD [facility] Pager [phone]  If 7PM-7AM, please contact night-coverage [email or web address] Password [email or web address]  08/20/2018, 1:08 AM

## 2018-08-20 NOTE — Discharge Summary (Signed)
Physician Discharge Summary  Shelly Petty NGE:[id number] DOB: December 09, 1995 DOA: 08/19/2018  PCP: Shawnee Knapp, MD  Admit date: 08/19/2018 Discharge date: 08/20/2018  Time spent: 55 minutes  Recommendations for Outpatient Follow-up:  1. Follow-up with Shawnee Knapp, MD in 1 to 2 weeks.  Patient states PCP no longer with her practice and will follow up with 1 of her partners.  Patient was prescribed 12 tablets of Ativan as needed until follow-up with PCP/practice. 2. Follow-up with [facility] in 2 weeks.   Discharge Diagnoses:  Principal Problem:   Intractable nausea and vomiting Active Problems:   Anemia   Anxiety state   Cyclical vomiting   Asymptomatic bacteriuria   Acute lower UTI   Acute blood loss anemia   Discharge Condition: Stable and improved.  Diet recommendation: Full liquid diet and advance to a soft diet as tolerated.  Filed Weights   08/19/18 1611 08/19/18 2244  Weight: 62.1 kg 62.2 kg    History of present illness:  Per Dr. Dessa Phi Norment is a 23 y.o. female with medical history significant of cyclic vomiting syndrome, IBS, depression, anxiety presented to the hospital for evaluation of nausea and vomiting. Patient stated her menstrual cycle started the morning prior to admission, and since then she has been having nausea and vomiting.  She had some lower abdominal cramps initially when her menstrual cycle started but is currently feeling comfortable.  Denied having any abdominal pain.  Denied having any diarrhea.  Stated her body temperature increases whenever she vomits.  Stated vomiting is a chronic problem for her and in the past episodes have not been associated with menstrual cycles.  Stated she has never been given an explanation for her symptoms.  Stated she saw GI since her previous hospitalization but has never had EGD done.  Denied having any dysuria, urinary frequency, or urgency.  Denied any drug use.  Reports feeling anxious  and is requesting Ativan.   Hospital Course:  1 intractable nausea and vomiting/history of recurrent nausea and vomiting Patient had presented with a 1 day history of intractable nausea and vomiting in the setting of her menstrual cycle.  Patient also with a history of anxiety.  Patient was afebrile with a normal white count.  Lipase and LFTs were normal.  Abdominal exam on presentation was unremarkable.  Patient received Protonix, Phenergan and Reglan in the ED but continued to have some emesis and as such was admitted.  Patient with no prior history of diabetes and gastroparesis felt to be less likely etiology.  Patient afebrile with no diarrhea and unlikely to have had a gastroenteritis.  Patient had a right upper quadrant ultrasound done in November 2009 which was unremarkable.  Patient was supposed to follow-up with gastroenterology in the outpatient setting in December 2019 and was felt patient symptoms were secondary to likely abdominal migraines versus anxiety.  Recommendations was to improve control of anxiety to prevent future episodes.  Patient was admitted placed on clear liquids, supportive care with antiemetics, IV fluids, IV PPI as well as Ativan as needed.  Urinalysis done was turbid, positive nitrites.  Urine cultures obtained.  UDS was not done and was pending.  Patient improved clinically and was advanced to a full liquid diet which he tolerated.  Patient had no further nausea or vomiting.  Patient wanted to go home as she had improved clinically.  Received 2 doses of IV Rocephin during this hospitalization due to concerns for possible UTI.  Patient be discharged home  on 1 more day of oral amoxicillin.  Patient was discharged home in stable and improved condition.  Outpatient follow-up with PCP.  2.  Probable UTI Patient presented with nausea vomiting urinalysis consistent with a UTI.  Urine cultures ordered however were pending at the time of discharge.  Patient received 2 doses of IV  Rocephin during his hospitalization and patient be discharged home on 1 more day of oral amoxicillin to complete a 3-day course of antibiotic treatment.  Patient will be discharged in stable and improved condition.  3.  Anxiety Patient maintained on Ativan 1 mg as needed.  Outpatient follow-up.  4.  Acute blood loss anemia Secondary to menses. Outpatient follow up with PCP.  Procedures:  None  Consultations:  None  Discharge Exam: Vitals:   08/20/18 1323 08/20/18 1511  BP: (!) 84/46 98/60  Pulse: (!) 106   Resp: 18   Temp: 98.5 F (36.9 C)   SpO2: 100%     General: NAD Cardiovascular: RRR Respiratory: CTAB  Discharge Instructions   Discharge Instructions    Diet - low sodium heart healthy   Complete by:  As directed    Soft diet   Increase activity slowly   Complete by:  As directed      Allergies as of 08/20/2018      Reactions   Prozac [fluoxetine Hcl] Nausea And Vomiting   Lexapro [escitalopram Oxalate] Nausea And Vomiting   Pt does not do well with SSRI medications   Serotonin Nausea And Vomiting      Medication List    TAKE these medications   acetaminophen 325 MG tablet Commonly known as:  TYLENOL Take 2 tablets (650 mg total) by mouth every 6 (six) hours as needed for mild pain (or Fever >/= 101).   amoxicillin 500 MG tablet Commonly known as:  AMOXIL Take 1 tablet (500 mg total) by mouth 3 (three) times daily for 1 day. Start taking on:  August 21, 2018   dicyclomine 20 MG tablet Commonly known as:  BENTYL Take 20 mg by mouth 2 (two) times daily.   hydrOXYzine 10 MG tablet Commonly known as:  ATARAX/VISTARIL Take 1-2 tablets (10-20 mg total) by mouth every 4 (four) hours as needed for anxiety.   LORazepam 1 MG tablet Commonly known as:  ATIVAN Take 1 tablet (1 mg total) by mouth 3 (three) times daily as needed for anxiety.   pantoprazole 40 MG tablet Commonly known as:  PROTONIX Take 1 tablet (40 mg total) by mouth daily.    promethazine 25 MG suppository Commonly known as:  PHENERGAN Place 25 mg rectally every 6 (six) hours as needed for nausea or vomiting.   promethazine 25 MG tablet Commonly known as:  PHENERGAN Take 1 tablet (25 mg total) by mouth every 6 (six) hours as needed for nausea or vomiting.   vitamin C 1000 MG tablet Take 1,000 mg by mouth daily.      Allergies  Allergen Reactions  . Prozac [Fluoxetine Hcl] Nausea And Vomiting  . Lexapro [Escitalopram Oxalate] Nausea And Vomiting    Pt does not do well with SSRI medications  . Serotonin Nausea And Vomiting   Follow-up Information    Shawnee Knapp, MD. Schedule an appointment as soon as possible for a visit in 2 week(s).   Specialty:  Family Medicine Why:  f/u in 1-2 weeks. Contact information: [place] [address] [phone]        Thornton Park, MD. Schedule an appointment  as soon as possible for a visit in 2 week(s).   Specialty:  Gastroenterology Contact information: [place] [address] [phone]            The results of significant diagnostics from this hospitalization (including imaging, microbiology, ancillary and laboratory) are listed below for reference.    Significant Diagnostic Studies: No results found.  Microbiology: No results found for this or any previous visit (from the past 240 hour(s)).   Labs: Basic Metabolic Panel: Recent Labs  Lab 08/19/18 1633 08/20/18 0328 08/20/18 0830  NA 137 136  --   K 3.6 3.7  --   CL 104 108  --   CO2 22 17*  --   GLUCOSE 101* 103*  --   BUN 6 6  --   CREATININE 0.59 0.52  --   CALCIUM 9.7 8.8*  --   MG  --   --  1.9   Liver Function Tests: Recent Labs  Lab 08/19/18 1633  AST 23  ALT 15  ALKPHOS 47  BILITOT 0.7  PROT 8.4*  ALBUMIN 5.4*   Recent Labs  Lab 08/19/18 1633  LIPASE 32   No results for input(s): AMMONIA in the last 168 hours. CBC: Recent Labs  Lab 08/19/18 1633 08/20/18 0328  WBC 12.4*  10.6*  NEUTROABS 10.1*  --   HGB 13.6 11.3*  HCT 41.8 35.1*  MCV 92.5 93.4  PLT 248 215   Cardiac Enzymes: No results for input(s): CKTOTAL, CKMB, CKMBINDEX, TROPONINI in the last 168 hours. BNP: BNP (last 3 results) No results for input(s): BNP in the last 8760 hours.  ProBNP (last 3 results) No results for input(s): PROBNP in the last 8760 hours.  CBG: No results for input(s): GLUCAP in the last 168 hours.     Signed:  Irine Seal MD.  [facility] 08/20/2018, 6:49 PM

## 2018-08-20 NOTE — Progress Notes (Signed)
Pt discharged home via partner. Discharge instructions reviewed and all questions answered.

## 2018-08-21 ENCOUNTER — Other Ambulatory Visit: Payer: Self-pay

## 2018-08-21 ENCOUNTER — Encounter (HOSPITAL_COMMUNITY): Payer: Self-pay

## 2018-08-21 ENCOUNTER — Inpatient Hospital Stay (HOSPITAL_COMMUNITY)
Admission: EM | Admit: 2018-08-21 | Discharge: 2018-08-25 | DRG: 641 | Disposition: A | Discharge status: Home / Self Care | Payer: 59 | Attending: Internal Medicine | Admitting: Internal Medicine

## 2018-08-21 DIAGNOSIS — R1115 Cyclical vomiting syndrome unrelated to migraine: Secondary | ICD-10-CM | POA: Diagnosis not present

## 2018-08-21 DIAGNOSIS — R1110 Vomiting, unspecified: Secondary | ICD-10-CM

## 2018-08-21 DIAGNOSIS — F411 Generalized anxiety disorder: Secondary | ICD-10-CM

## 2018-08-21 DIAGNOSIS — R112 Nausea with vomiting, unspecified: Secondary | ICD-10-CM

## 2018-08-21 DIAGNOSIS — D72829 Elevated white blood cell count, unspecified: Secondary | ICD-10-CM

## 2018-08-21 DIAGNOSIS — R110 Nausea: Secondary | ICD-10-CM

## 2018-08-21 DIAGNOSIS — K589 Irritable bowel syndrome without diarrhea: Secondary | ICD-10-CM

## 2018-08-21 DIAGNOSIS — E876 Hypokalemia: Secondary | ICD-10-CM | POA: Diagnosis present

## 2018-08-21 DIAGNOSIS — N390 Urinary tract infection, site not specified: Secondary | ICD-10-CM | POA: Diagnosis not present

## 2018-08-21 LAB — URINALYSIS, ROUTINE W REFLEX MICROSCOPIC
Bacteria, UA: NONE SEEN
Bilirubin Urine: NEGATIVE
Glucose, UA: NEGATIVE mg/dL
Ketones, ur: 5 mg/dL — AB
Leukocytes,Ua: NEGATIVE
Nitrite: NEGATIVE
PH: 5 (ref 5.0–8.0)
Protein, ur: NEGATIVE mg/dL
Specific Gravity, Urine: 1.009 (ref 1.005–1.030)

## 2018-08-21 LAB — CBC
HCT: 38.5 % (ref 36.0–46.0)
Hemoglobin: 12.7 g/dL (ref 12.0–15.0)
MCH: 30.5 pg (ref 26.0–34.0)
MCHC: 33 g/dL (ref 30.0–36.0)
MCV: 92.5 fL (ref 80.0–100.0)
Platelets: 177 10*3/uL (ref 150–400)
RBC: 4.16 MIL/uL (ref 3.87–5.11)
RDW: 11.9 % (ref 11.5–15.5)
WBC: 10.7 10*3/uL — ABNORMAL HIGH (ref 4.0–10.5)
nRBC: 0 % (ref 0.0–0.2)

## 2018-08-21 LAB — BASIC METABOLIC PANEL
Anion gap: 10 (ref 5–15)
BUN: 5 mg/dL — ABNORMAL LOW (ref 6–20)
CALCIUM: 9.5 mg/dL (ref 8.9–10.3)
CO2: 22 mmol/L (ref 22–32)
Chloride: 108 mmol/L (ref 98–111)
Creatinine, Ser: 0.63 mg/dL (ref 0.44–1.00)
GFR calc Af Amer: >60 mL/min (ref >60)
GFR calc non Af Amer: >60 mL/min (ref >60)
Glucose, Bld: 99 mg/dL (ref 70–99)
Potassium: 3.4 mmol/L — ABNORMAL LOW (ref 3.5–5.1)
SODIUM: 140 mmol/L (ref 135–145)

## 2018-08-21 MED ORDER — SODIUM CHLORIDE 0.9 % IV SOLN
1.0000 g | Freq: Once | INTRAVENOUS | Status: AC
  Administered 2018-08-21: 1 g via INTRAVENOUS
  Filled 2018-08-21: qty 1

## 2018-08-21 MED ORDER — LORAZEPAM 2 MG/ML IJ SOLN
0.5000 mg | Freq: Four times a day (QID) | INTRAMUSCULAR | Status: DC | PRN
  Administered 2018-08-21 – 2018-08-23 (×3): 0.5 mg via INTRAVENOUS
  Filled 2018-08-21 (×3): qty 1

## 2018-08-21 MED ORDER — PROMETHAZINE HCL 25 MG/ML IJ SOLN
25.0000 mg | Freq: Once | INTRAMUSCULAR | Status: AC
  Administered 2018-08-21: 25 mg via INTRAVENOUS
  Filled 2018-08-21: qty 1

## 2018-08-21 MED ORDER — PANTOPRAZOLE SODIUM 40 MG IV SOLR
40.0000 mg | INTRAVENOUS | Status: DC
  Administered 2018-08-21 – 2018-08-22 (×2): 40 mg via INTRAVENOUS
  Filled 2018-08-21 (×2): qty 40

## 2018-08-21 MED ORDER — ACETAMINOPHEN 325 MG PO TABS: 650.0000 mg | ORAL_TABLET | Freq: Four times a day (QID) | ORAL | Status: DC | PRN

## 2018-08-21 MED ORDER — LACTATED RINGERS IV BOLUS
1000.0000 mL | Freq: Once | INTRAVENOUS | Status: AC
  Administered 2018-08-21: 1000 mL via INTRAVENOUS

## 2018-08-21 MED ORDER — PROMETHAZINE HCL 25 MG/ML IJ SOLN
12.5000 mg | Freq: Four times a day (QID) | INTRAMUSCULAR | Status: DC | PRN
  Administered 2018-08-23 (×2): 12.5 mg via INTRAVENOUS
  Filled 2018-08-21 (×2): qty 1

## 2018-08-21 MED ORDER — ENOXAPARIN SODIUM 40 MG/0.4ML ~~LOC~~ SOLN
40.0000 mg | SUBCUTANEOUS | Status: DC
  Filled 2018-08-21: qty 0.4

## 2018-08-21 MED ORDER — KCL IN DEXTROSE-NACL 20-5-0.45 MEQ/L-%-% IV SOLN
INTRAVENOUS | Status: DC
  Administered 2018-08-21 – 2018-08-25 (×8): via INTRAVENOUS
  Filled 2018-08-21 (×9): qty 1000

## 2018-08-21 MED ORDER — DICYCLOMINE HCL 10 MG/ML IM SOLN
20.0000 mg | Freq: Once | INTRAMUSCULAR | Status: AC
  Administered 2018-08-21: 20 mg via INTRAMUSCULAR
  Filled 2018-08-21: qty 2

## 2018-08-21 MED ORDER — SODIUM CHLORIDE 0.9% FLUSH
3.0000 mL | Freq: Two times a day (BID) | INTRAVENOUS | Status: DC
  Administered 2018-08-24 – 2018-08-25 (×2): 3 mL via INTRAVENOUS

## 2018-08-21 MED ORDER — LORAZEPAM 2 MG/ML IJ SOLN
1.0000 mg | Freq: Once | INTRAMUSCULAR | Status: AC
  Administered 2018-08-21: 1 mg via INTRAVENOUS
  Filled 2018-08-21: qty 1

## 2018-08-21 MED ORDER — ACETAMINOPHEN 650 MG RE SUPP: 650.0000 mg | Freq: Four times a day (QID) | RECTAL | Status: DC | PRN

## 2018-08-21 MED ORDER — ONDANSETRON HCL 4 MG/2ML IJ SOLN
4.0000 mg | Freq: Four times a day (QID) | INTRAMUSCULAR | Status: DC | PRN
  Administered 2018-08-21 – 2018-08-23 (×2): 4 mg via INTRAVENOUS
  Filled 2018-08-21 (×3): qty 2

## 2018-08-21 NOTE — ED Notes (Signed)
ED Provider at bedside. 

## 2018-08-21 NOTE — ED Provider Notes (Signed)
[facility] DEPT Provider Note   CSN: [id number] Arrival date & time: 08/21/18  1039    History   Chief Complaint Chief Complaint  Patient presents with  . Emesis    HPI Shelly Petty is a 23 y.o. female.     HPI Patient presents with nausea and vomiting.  Reported history of cyclic vomiting syndrome.  Was discharged from the hospital yesterday for similar symptoms.  States she been doing well but she does usually a good day and a bad day followed by a good day.  States yesterday was a good day.  States the cycle she thinks will only break if she gets to good days in the hospital.  Been treated for UTI has not been able to keep her antibiotics down.  Has been given Rocephin for home.  On Protonix.  Patient states this is her fourth episode of her cyclic vomiting.  States she was diagnosed in the ER with a previous visit.  Has seen gastroenterology however.  No fevers but states she has had chills sometimes with the vomiting.  Denies dysuria Past Medical History:  Diagnosis Date  . Anemia   . Anxiety   . Chicken pox   . Cyclical vomiting   . Cyclical vomiting syndrome   . Depression   . Family history of adverse reaction to anesthesia    sister had a hard time   . IBS (irritable bowel syndrome)     Patient Active Problem List   Diagnosis Date Noted  . Asymptomatic bacteriuria 08/20/2018  . Acute lower UTI 08/20/2018  . Acute blood loss anemia 08/20/2018  . Intractable nausea and vomiting 08/19/2018  . Cyclical vomiting   . Nausea & vomiting 04/26/2018  . Immune to hepatitis B 08/24/2017  . Anemia 08/20/2017  . History of varicella 08/20/2017  . Fibroadenoma of both breasts 08/20/2017  . Anxiety state 08/17/2012  . Depression 09/05/2011    Past Surgical History:  Procedure Laterality Date  . NO PAST SURGERIES       OB History   No obstetric history on file.      Home Medications    Prior to Admission medications   Medication  Sig Start Date End Date Taking? Authorizing Provider  hydrOXYzine (ATARAX/VISTARIL) 10 MG tablet Take 1-2 tablets (10-20 mg total) by mouth every 4 (four) hours as needed for anxiety. 08/20/18  Yes Stallings, Zoe A, MD  LORazepam (ATIVAN) 1 MG tablet Take 1 tablet (1 mg total) by mouth 3 (three) times daily as needed for anxiety. 08/20/18  Yes Eugenie Filler, MD  pantoprazole (PROTONIX) 40 MG tablet Take 1 tablet (40 mg total) by mouth daily. 08/20/18  Yes Eugenie Filler, MD  promethazine (PHENERGAN) 25 MG tablet Take 1 tablet (25 mg total) by mouth every 6 (six) hours as needed for nausea or vomiting. 08/20/18  Yes Eugenie Filler, MD  acetaminophen (TYLENOL) 325 MG tablet Take 2 tablets (650 mg total) by mouth every 6 (six) hours as needed for mild pain (or Fever >/= 101). 08/20/18   Eugenie Filler, MD  amoxicillin (AMOXIL) 500 MG tablet Take 1 tablet (500 mg total) by mouth 3 (three) times daily for 1 day. 08/21/18 08/22/18  Eugenie Filler, MD  Ascorbic Acid (VITAMIN C) 1000 MG tablet Take 1,000 mg by mouth daily.    [provider]  dicyclomine (BENTYL) 20 MG tablet Take 20 mg by mouth 2 (two) times daily.    [provider]  promethazine (PHENERGAN) 25 MG suppository Place 25 mg rectally every 6 (six) hours as needed for nausea or vomiting.    [provider]    Family History Family History  Problem Relation Age of Onset  . Mental illness Mother        depression  . Melanoma Mother   . Diabetes Father   . Hypertension Father   . Hyperlipidemia Father   . Mental illness Sister   . Mental illness Brother   . Drug abuse Brother     Social History Social History   Tobacco Use  . Smoking status: Former Smoker    Types: Cigarettes  . Smokeless tobacco: Never Used  . Tobacco comment: never regular smoking  Substance Use Topics  . Alcohol use: Yes    Frequency: Never    Comment: once a year, on Christmas, with her mother  . Drug use: Not  Currently    Comment: previous marijuana use (last use age 71)     Allergies   Prozac [fluoxetine hcl]; Lexapro [escitalopram oxalate]; and Serotonin   Review of Systems Review of Systems  Constitutional: Positive for appetite change and chills.  HENT: Negative for congestion.   Respiratory: Negative for shortness of breath.   Cardiovascular: Negative for chest pain.  Gastrointestinal: Positive for nausea and vomiting. Negative for abdominal pain.  Endocrine: Negative for polyuria.  Genitourinary: Negative for dysuria and flank pain.  Musculoskeletal: Negative for back pain.  Skin: Negative for pallor.  Neurological: Positive for weakness.  Psychiatric/Behavioral: Negative for confusion.     Physical Exam Updated Vital Signs BP 97/78   Pulse 80   Temp 99.8 F (37.7 C) (Oral)   Resp 15   Ht 5\' 3"  (1.6 m)   Wt 62.2 kg   LMP 08/19/2018   SpO2 100%   BMI 24.29 kg/m   Physical Exam Vitals signs and nursing note reviewed.  HENT:     Head: Normocephalic.     Mouth/Throat:     Mouth: Mucous membranes are dry.  Cardiovascular:     Rate and Rhythm: Normal rate and regular rhythm.  Pulmonary:     Breath sounds: Normal breath sounds.  Abdominal:     Palpations: Abdomen is soft.  Musculoskeletal:        General: No tenderness.  Skin:    General: Skin is warm.     Capillary Refill: Capillary refill takes less than 2 seconds.  Neurological:     Mental Status: She is alert and oriented to person, place, and time.      ED Treatments / Results  Labs (all labs ordered are listed, but only abnormal results are displayed) Labs Reviewed  BASIC METABOLIC PANEL - Abnormal; Notable for the following components:      Result Value   Potassium 3.4 (*)    BUN 5 (*)    All other components within normal limits  CBC - Abnormal; Notable for the following components:   WBC 10.7 (*)    All other components within normal limits  URINALYSIS, ROUTINE W REFLEX MICROSCOPIC -  Abnormal; Notable for the following components:   Color, Urine STRAW (*)    Hgb urine dipstick MODERATE (*)    Ketones, ur 5 (*)    All other components within normal limits    EKG None  Radiology No results found.  Procedures Procedures (including critical care time)  Medications Ordered in ED Medications  promethazine (PHENERGAN) injection 25 mg (25 mg Intravenous Given 08/21/18 1139)  lactated ringers bolus 1,000 mL (0 mLs Intravenous Stopped 08/21/18 1250)     Initial Impression / Assessment and Plan / ED Course  I have reviewed the triage vital signs and the nursing notes.  Pertinent labs & imaging results that were available during my care of the patient were reviewed by me and considered in my medical decision making (see chart for details).        Patient presents with nausea and vomiting.  Recent admission for same.  Continues to vomit while in the ER has not tolerated orals.  Lab work improved from before but has not been able to keep her antibiotics down.  Urine cultures showed staph aureus but not sensitivities yet.  Patient also states she has been unable to keep her anxiety medicines down.  Will discuss with hospitalist about admission the hospital for intractable vomiting.  Final Clinical Impressions(s) / ED Diagnoses   Final diagnoses:  Intractable vomiting with nausea, unspecified vomiting type  Urinary tract infection without hematuria, site unspecified    ED Discharge Orders    None       Davonna Belling, MD 08/21/18 1259

## 2018-08-21 NOTE — H&P (Signed)
History and Physical   Shelly Petty MHD:[id number] DOB: 03-22-1996 DOA: 08/21/2018  Referring MD/NP/PA: Dr. Alvino Chapel, [place] PCP: [place] Knapp, MD Outpatient Specialists: GI, Dr. Tarri Glenn  Patient coming from: Home  Chief Complaint: Vomiting  HPI: Shelly Petty is a 23 y.o. female with a history of cyclic vomiting syndrome, anxiety, and recent UTI, admission for nausea and vomiting who presents with these complaints. They were tolerating a liquid diet and subsequently discharged 3/20 after overnight observation, prescribed amoxicillin to complete course for UTI. Was feeling better until this morning when they had recurrent emesis. Attempted to take meds including amoxicillin but threw them up and subsequently 7 times or so with associated constant diffuse, severe abdominal pulsing pain "like my stomach is having a seizure." Has intermittent chronic diarrhea, but none now. No blood in emesis. Does not and has not had dysuria, urinary frequency, urgency or fever. No URI symptoms or sick contacts. This is the 4th hospitalization for these symptoms. Their partner reports improvement in the past with several days of hospitalization and IM bentyl.  ED Course: Vital signs stable, labs stable. Patient continued to have emesis despite IV antiemetics in the ED, so is being readmitted for intractable vomiting.   Review of Systems: Denies weight loss, dysphagia, odynophagia, globus sensation, cough, sore throat, chest pain, palpitations, shortness of breathchanges in bowel habits, blood in stool, change in bladder habits, and per HPI. All others reviewed and are negative.   Past Medical History:  Diagnosis Date  . Anemia   . Anxiety   . Chicken pox   . Cyclical vomiting   . Cyclical vomiting syndrome   . Depression   . Family history of adverse reaction to anesthesia    sister had a hard time   . IBS (irritable bowel syndrome)    Past Surgical History:  Procedure Laterality Date  . NO PAST SURGERIES      - Pronouns are: They, them, their. Has a partner.  reports that she has quit smoking. Her smoking use included cigarettes. She has never used smokeless tobacco. She reports current alcohol use. She reports previous drug use. Allergies  Allergen Reactions  . Prozac [Fluoxetine Hcl] Nausea And Vomiting  . Lexapro [Escitalopram Oxalate] Nausea And Vomiting    Pt does not do well with SSRI medications  . Serotonin Nausea And Vomiting   Family History  Problem Relation Age of Onset  . Mental illness Mother        depression  . Melanoma Mother   . Diabetes Father   . Hypertension Father   . Hyperlipidemia Father   . Mental illness Sister   . Mental illness Brother   . Drug abuse Brother    - Family history otherwise reviewed and not pertinent.   Prior to Admission medications   Medication Sig Start Date End Date Taking? Authorizing Provider  hydrOXYzine (ATARAX/VISTARIL) 10 MG tablet Take 1-2 tablets (10-20 mg total) by mouth every 4 (four) hours as needed for anxiety. 08/20/18  Yes Stallings, Zoe A, MD  LORazepam (ATIVAN) 1 MG tablet Take 1 tablet (1 mg total) by mouth 3 (three) times daily as needed for anxiety. 08/20/18  Yes Eugenie Filler, MD  pantoprazole (PROTONIX) 40 MG tablet Take 1 tablet (40 mg total) by mouth daily. 08/20/18  Yes Eugenie Filler, MD  promethazine (PHENERGAN) 25 MG tablet Take 1 tablet (25 mg total) by mouth every 6 (six) hours as needed for nausea or vomiting. 08/20/18  Yes Eugenie Filler, MD  acetaminophen (TYLENOL) 325 MG tablet Take 2 tablets (650 mg total) by mouth every 6 (six) hours as needed for mild pain (or Fever >/= 101). 08/20/18   Eugenie Filler, MD  Ascorbic Acid (VITAMIN C) 1000 MG tablet Take 1,000 mg by mouth daily.    [provider]  dicyclomine (BENTYL) 20 MG tablet Take 20 mg by mouth 2 (two) times daily.    [provider]  promethazine (PHENERGAN) 25 MG suppository Place 25 mg rectally every 6 (six) hours as  needed for nausea or vomiting.    [provider]    Physical Exam: Vitals:   08/21/18 1050 08/21/18 1130 08/21/18 1200 08/21/18 1232  BP: 128/77 116/78 (!) 113/57 97/78  Pulse: 87 (!) 56 83 80  Resp: 15 17 17 15   Temp: 99.8 F (37.7 C)     TempSrc: Oral     SpO2: 100% 100% 100% 100%  Weight:      Height:       Constitutional: Tired-appearing 22yo in no distress Eyes: Lids and conjunctivae normal, PERRL ENMT: Mucous membranes are tacky.  Neck: Supple, no masses, no thyromegaly Respiratory: Non-labored breathing room air without accessory muscle use. Clear breath sounds to auscultation bilaterally Cardiovascular: Regular with sinus arrhythmia, no murmurs, rubs, or gallops. No carotid bruits. No JVD. No LE edema. 2+ pedal pulses. Abdomen: Normoactive bowel sounds. Diffuse tenderness without focality, no rebound or guarding. No hepatosplenomegaly. GU: No indwelling catheter Musculoskeletal: No clubbing / cyanosis. No joint deformity upper and lower extremities. Good ROM, no contractures. Normal muscle tone.  Skin: Warm, dry. No rashes, wounds, or ulcers on visualized skin.  Neurologic: CN II-XII grossly intact. Speech normal, low volume. No focal deficits in motor strength or sensation in all extremities.  Psychiatric: Drowsy having just received ativan, but rousable and oriented x3.   Labs on Admission: I have personally reviewed following labs and imaging studies  CBC: Recent Labs  Lab 08/19/18 1633 08/20/18 0328 08/21/18 1059  WBC 12.4* 10.6* 10.7*  NEUTROABS 10.1*  --   --   HGB 13.6 11.3* 12.7  HCT 41.8 35.1* 38.5  MCV 92.5 93.4 92.5  PLT 248 215 829   Basic Metabolic Panel: Recent Labs  Lab 08/19/18 1633 08/20/18 0328 08/20/18 0830 08/21/18 1059  NA 137 136  --  140  K 3.6 3.7  --  3.4*  CL 104 108  --  108  CO2 22 17*  --  22  GLUCOSE 101* 103*  --  99  BUN 6 6  --  5*  CREATININE 0.59 0.52  --  0.63  CALCIUM 9.7 8.8*  --  9.5  MG  --   --  1.9   --    GFR: Estimated Creatinine Clearance: 89.7 mL/min (by C-G formula based on SCr of 0.63 mg/dL). Liver Function Tests: Recent Labs  Lab 08/19/18 1633  AST 23  ALT 15  ALKPHOS 47  BILITOT 0.7  PROT 8.4*  ALBUMIN 5.4*   Recent Labs  Lab 08/19/18 1633  LIPASE 32   Urine analysis:    Component Value Date/Time   COLORURINE STRAW (A) 08/21/2018 1059   APPEARANCEUR CLEAR 08/21/2018 1059   LABSPEC 1.009 08/21/2018 1059   PHURINE 5.0 08/21/2018 1059   GLUCOSEU NEGATIVE 08/21/2018 1059   HGBUR MODERATE (A) 08/21/2018 1059   BILIRUBINUR NEGATIVE 08/21/2018 1059   BILIRUBINUR negative 05/06/2018 1658   KETONESUR 5 (A) 08/21/2018 1059   PROTEINUR NEGATIVE 08/21/2018 1059   UROBILINOGEN 0.2  05/06/2018 1658   NITRITE NEGATIVE 08/21/2018 1059   LEUKOCYTESUR NEGATIVE 08/21/2018 1059    Recent Results (from the past 240 hour(s))  Urine culture     Status: Abnormal (Preliminary result)   Collection Time: 08/19/18  4:33 PM  Result Value Ref Range Status   Specimen Description   Final    URINE, CLEAN CATCH Performed at [facility], [address] [place], [place] [address]    Special Requests   Final    NONE Performed at [facility], [address] [place], [place] [address]    Culture (A)  Final    80,000 COLONIES/mL STAPHYLOCOCCUS AUREUS SUSCEPTIBILITIES TO FOLLOW Performed at [facility], [place] [address] [place], [place] [address]    Report Status PENDING  Incomplete    Assessment/Plan Principal Problem:   Intractable nausea and vomiting Active Problems:   Anxiety state   Cyclical vomiting   Acute lower UTI   IBS (irritable bowel syndrome)   Hypokalemia   Leukocytosis   Intractable nausea and vomiting, cyclic vomiting syndrome (presumed diagnosis):  - Has been evaluated by GI in the recent past who felt symptoms attributable to anxiety vs. abdominal migraine. No further work up recommended. Patient is having no red flag symptoms  to suggest urgent EGD is warranted and we will avoid nonessential procedures at this time. - Unable to tolerate po. Will admit, give IVF, IV antiemetics (zofran w/phenergan for refractory symptoms), IV ativan, and IV PPI. Will also trial IM bentyl, can continue if felt to be helpful.  Anxiety:  - Continue IV ativan prn. Seems somnolent in the ED, will decrease dose   GERD:  - PPI IV  UTI: Patient reports no symptoms, though was treated probably because of leukocytosis. Culture grown S. aureus (sensitivity pending). With improvement in urine findings with CTX, do not suspect this was methicillin resistant. If it was, then it is not likely to be clinically salient.  - Complete 3rd dose of CTX.  Leukocytosis: No fever, stable from prior.  - Suspect stress reaction, will monitor.  Hypokalemia: Supplement in IVF, recheck.  DVT prophylaxis: Lovenox  Code Status: Full  Family Communication: Partner at bedside. Due to visitor restrictions, requests updates by phone if any change in plan as above is proposed.  Disposition Plan: Home once able to tolerate per oral intake reliably Consults called: None  Admission status: Observation    Patrecia Pour, MD [facility] [email or web address] Password [email or web address] 08/21/2018, 1:42 PM

## 2018-08-21 NOTE — ED Triage Notes (Signed)
Vomitting since this morning around 0800-0900. Patient seen and treated at [facility] facility for cyclic vomiting 0-07 days ago. Patient is AOx4 and ambulatory. Patient significant other at bedside. Patient took lexapro and protonix earlier this morning.

## 2018-08-21 NOTE — ED Notes (Signed)
Hospitalist at bedside 

## 2018-08-21 NOTE — ED Notes (Signed)
ED TO INPATIENT HANDOFF REPORT  ED Nurse Name and Phone #: Josph Macho RN  S Name/Age/Gender Shelly Petty 23 y.o. female Room/Bed: WA21/WA21  Code Status   Code Status: Prior  Home/SNF/Other Home Patient oriented to: self, place, time and situation Is this baseline? Yes   Triage Complete: Triage complete  Chief Complaint vomiting  Triage Note Vomitting since this morning around 0800-0900. Patient seen and treated at [facility] facility for cyclic vomiting 8-29 days ago. Patient is AOx4 and ambulatory. Patient significant other at bedside. Patient took lexapro and protonix earlier this morning.   Allergies Allergies  Allergen Reactions  . Prozac [Fluoxetine Hcl] Nausea And Vomiting  . Lexapro [Escitalopram Oxalate] Nausea And Vomiting    Pt does not do well with SSRI medications  . Serotonin Nausea And Vomiting    Level of Care/Admitting Diagnosis ED Disposition    ED Disposition Condition Comment   Admit  Hospital Area: [facility] [[id number]]  Level of Care: Med-Surg [16]  Diagnosis: Intractable nausea and vomiting [678938]  Admitting Physician: Patrecia Pour [[id number]]  Attending Physician: Patrecia Pour [phone]  PT Class (Do Not Modify): Observation [104]  PT Acc Code (Do Not Modify): Observation [10022]       B Medical/Surgery History Past Medical History:  Diagnosis Date  . Anemia   . Anxiety   . Chicken pox   . Cyclical vomiting   . Cyclical vomiting syndrome   . Depression   . Family history of adverse reaction to anesthesia    sister had a hard time   . IBS (irritable bowel syndrome)    Past Surgical History:  Procedure Laterality Date  . NO PAST SURGERIES       A IV Location/Drains/Wounds Patient Lines/Drains/Airways Status   Active Line/Drains/Airways    Name:   Placement date:   Placement time:   Site:   Days:   Peripheral IV 08/21/18 Right;Upper Arm   08/21/18    1133    Arm   less than 1          Intake/Output Last 24  hours  Intake/Output Summary (Last 24 hours) at 08/21/2018 1339 Last data filed at 08/21/2018 1250 Gross per 24 hour  Intake 1000 ml  Output -  Net 1000 ml    Labs/Imaging Results for orders placed or performed during the hospital encounter of 08/21/18 (from the past 48 hour(s))  Basic metabolic panel     Status: Abnormal   Collection Time: 08/21/18 10:59 AM  Result Value Ref Range   Sodium 140 135 - 145 mmol/L   Potassium 3.4 (L) 3.5 - 5.1 mmol/L   Chloride 108 98 - 111 mmol/L   CO2 22 22 - 32 mmol/L   Glucose, Bld 99 70 - 99 mg/dL   BUN 5 (L) 6 - 20 mg/dL   Creatinine, Ser 0.63 0.44 - 1.00 mg/dL   Calcium 9.5 8.9 - 10.3 mg/dL   GFR calc non Af Amer >60 >60 mL/min   GFR calc Af Amer >60 >60 mL/min   Anion gap 10 5 - 15    Comment: Performed at [facility], [place] [address], [place], [place] [address]  CBC     Status: Abnormal   Collection Time: 08/21/18 10:59 AM  Result Value Ref Range   WBC 10.7 (H) 4.0 - 10.5 K/uL   RBC 4.16 3.87 - 5.11 MIL/uL   Hemoglobin 12.7 12.0 - 15.0 g/dL   HCT 38.5 36.0 - 46.0 %  MCV 92.5 80.0 - 100.0 fL   MCH 30.5 26.0 - 34.0 pg   MCHC 33.0 30.0 - 36.0 g/dL   RDW 11.9 11.5 - 15.5 %   Platelets 177 150 - 400 K/uL   nRBC 0.0 0.0 - 0.2 %    Comment: Performed at [facility], [place] [address] [place], [place] [address]  Urinalysis, Routine w reflex microscopic     Status: Abnormal   Collection Time: 08/21/18 10:59 AM  Result Value Ref Range   Color, Urine STRAW (A) YELLOW   APPearance CLEAR CLEAR   Specific Gravity, Urine 1.009 1.005 - 1.030   pH 5.0 5.0 - 8.0   Glucose, UA NEGATIVE NEGATIVE mg/dL   Hgb urine dipstick MODERATE (A) NEGATIVE   Bilirubin Urine NEGATIVE NEGATIVE   Ketones, ur 5 (A) NEGATIVE mg/dL   Protein, ur NEGATIVE NEGATIVE mg/dL   Nitrite NEGATIVE NEGATIVE   Leukocytes,Ua NEGATIVE NEGATIVE   RBC / HPF 0-5 0 - 5 RBC/hpf   WBC, UA 0-5 0 - 5 WBC/hpf   Bacteria, UA NONE SEEN NONE SEEN    Squamous Epithelial / LPF 0-5 0 - 5    Comment: Performed at [facility], [place] [address], [place], [place] [address]   No results found.  Pending Labs Unresulted Labs (From admission, onward)   None      Vitals/Pain Today's Vitals   08/21/18 1051 08/21/18 1130 08/21/18 1200 08/21/18 1232  BP:  116/78 (!) 113/57 97/78  Pulse:  (!) 56 83 80  Resp:  17 17 15   Temp:      TempSrc:      SpO2:  100% 100% 100%  Weight:      Height:      PainSc: 0-No pain       Isolation Precautions No active isolations  Medications Medications  dextrose 5 % and 0.45 % NaCl with KCl 20 mEq/L infusion (has no administration in time range)  promethazine (PHENERGAN) injection 12.5 mg (has no administration in time range)  ondansetron (ZOFRAN) injection 4 mg (has no administration in time range)  cefTRIAXone (ROCEPHIN) 1 g in sodium chloride 0.9 % 100 mL IVPB (has no administration in time range)  promethazine (PHENERGAN) injection 25 mg (25 mg Intravenous Given 08/21/18 1139)  lactated ringers bolus 1,000 mL (0 mLs Intravenous Stopped 08/21/18 1250)  LORazepam (ATIVAN) injection 1 mg (1 mg Intravenous Given 08/21/18 1316)    Mobility walks Low fall risk   Focused Assessments    R Recommendations: See Admitting Provider Note  Report given to:   Additional Notes:

## 2018-08-21 NOTE — Progress Notes (Signed)
MEWS Guidelines - (patients age 25 and over)  Red - At High Risk for Deterioration Yellow - At risk for Deterioration  1. Go to room and assess patient 2. Validate data. Is this patient's baseline? If data confirmed: 3. Is this an acute change? 4. Administer prn meds/treatments as ordered. 5. Note Sepsis score 6. Review goals of care 7. Sports coach, RRT nurse and Provider. 8. Ask Provider to come to bedside.  9. Document patient condition/interventions/response. 10. Increase frequency of vital signs and focused assessments to at least q15 minutes x 4, then q30 minutes x2. - If stable, then q1h x3, then q4h x3 and then q8h or dept. routine. - If unstable, contact Provider & RRT nurse. Prepare for possible transfer. 11. Add entry in progress notes using the smart phrase ".MEWS". 1. Go to room and assess patient 2. Validate data. Is this patient's baseline? If data confirmed: 3. Is this an acute change? 4. Administer prn meds/treatments as ordered? 5. Note Sepsis score 6. Review goals of care 7. Sports coach and Provider 8. Call RRT nurse as needed. 9. Document patient condition/interventions/response. 10. Increase frequency of vital signs and focused assessments to at least q2h x2. - If stable, then q4h x2 and then q8h or dept. routine. - If unstable, contact Provider & RRT nurse. Prepare for possible transfer. 11. Add entry in progress notes using the smart phrase ".MEWS".  Green - Likely stable Lavender - Comfort Care Only  1. Continue routine/ordered monitoring.  2. Review goals of care. 1. Continue routine/ordered monitoring. 2. Review goals of care.    Patient is in the yellow at this time due to low BP and pulse. MD notified.  Bolus being administered at this time.  Will check vitals once bolus is finished infusing.  Patient was asymptomatic and has a trend of bp/pulse being low at times. Shelly Petty

## 2018-08-22 DIAGNOSIS — K2950 Unspecified chronic gastritis without bleeding: Secondary | ICD-10-CM | POA: Diagnosis not present

## 2018-08-22 DIAGNOSIS — K589 Irritable bowel syndrome without diarrhea: Secondary | ICD-10-CM | POA: Diagnosis present

## 2018-08-22 DIAGNOSIS — Z818 Family history of other mental and behavioral disorders: Secondary | ICD-10-CM | POA: Diagnosis not present

## 2018-08-22 DIAGNOSIS — I959 Hypotension, unspecified: Secondary | ICD-10-CM | POA: Diagnosis not present

## 2018-08-22 DIAGNOSIS — Z808 Family history of malignant neoplasm of other organs or systems: Secondary | ICD-10-CM | POA: Diagnosis not present

## 2018-08-22 DIAGNOSIS — E876 Hypokalemia: Secondary | ICD-10-CM | POA: Diagnosis not present

## 2018-08-22 DIAGNOSIS — Z8349 Family history of other endocrine, nutritional and metabolic diseases: Secondary | ICD-10-CM | POA: Diagnosis not present

## 2018-08-22 DIAGNOSIS — Z87891 Personal history of nicotine dependence: Secondary | ICD-10-CM | POA: Diagnosis not present

## 2018-08-22 DIAGNOSIS — F411 Generalized anxiety disorder: Secondary | ICD-10-CM | POA: Diagnosis present

## 2018-08-22 DIAGNOSIS — F329 Major depressive disorder, single episode, unspecified: Secondary | ICD-10-CM | POA: Diagnosis present

## 2018-08-22 DIAGNOSIS — R109 Unspecified abdominal pain: Secondary | ICD-10-CM | POA: Diagnosis not present

## 2018-08-22 DIAGNOSIS — Z8249 Family history of ischemic heart disease and other diseases of the circulatory system: Secondary | ICD-10-CM | POA: Diagnosis not present

## 2018-08-22 DIAGNOSIS — Z813 Family history of other psychoactive substance abuse and dependence: Secondary | ICD-10-CM | POA: Diagnosis not present

## 2018-08-22 DIAGNOSIS — Z833 Family history of diabetes mellitus: Secondary | ICD-10-CM | POA: Diagnosis not present

## 2018-08-22 DIAGNOSIS — R1110 Vomiting, unspecified: Secondary | ICD-10-CM | POA: Diagnosis not present

## 2018-08-22 DIAGNOSIS — R112 Nausea with vomiting, unspecified: Secondary | ICD-10-CM | POA: Diagnosis not present

## 2018-08-22 DIAGNOSIS — K219 Gastro-esophageal reflux disease without esophagitis: Secondary | ICD-10-CM | POA: Diagnosis present

## 2018-08-22 DIAGNOSIS — E860 Dehydration: Secondary | ICD-10-CM | POA: Diagnosis present

## 2018-08-22 DIAGNOSIS — Z79899 Other long term (current) drug therapy: Secondary | ICD-10-CM | POA: Diagnosis not present

## 2018-08-22 DIAGNOSIS — R1115 Cyclical vomiting syndrome unrelated to migraine: Secondary | ICD-10-CM | POA: Diagnosis not present

## 2018-08-22 DIAGNOSIS — N390 Urinary tract infection, site not specified: Secondary | ICD-10-CM | POA: Diagnosis not present

## 2018-08-22 DIAGNOSIS — D649 Anemia, unspecified: Secondary | ICD-10-CM | POA: Diagnosis present

## 2018-08-22 DIAGNOSIS — Z888 Allergy status to other drugs, medicaments and biological substances status: Secondary | ICD-10-CM | POA: Diagnosis not present

## 2018-08-22 LAB — BASIC METABOLIC PANEL
Anion gap: 8 (ref 5–15)
BUN: 5 mg/dL — AB (ref 6–20)
CO2: 23 mmol/L (ref 22–32)
Calcium: 8.6 mg/dL — ABNORMAL LOW (ref 8.9–10.3)
Chloride: 108 mmol/L (ref 98–111)
Creatinine, Ser: 0.6 mg/dL (ref 0.44–1.00)
GFR calc Af Amer: >60 mL/min (ref >60)
GFR calc non Af Amer: >60 mL/min (ref >60)
Glucose, Bld: 93 mg/dL (ref 70–99)
Potassium: 2.8 mmol/L — ABNORMAL LOW (ref 3.5–5.1)
Sodium: 139 mmol/L (ref 135–145)

## 2018-08-22 LAB — CBC
HCT: 31.1 % — ABNORMAL LOW (ref 36.0–46.0)
Hemoglobin: 10.3 g/dL — ABNORMAL LOW (ref 12.0–15.0)
MCH: 30.2 pg (ref 26.0–34.0)
MCHC: 33.1 g/dL (ref 30.0–36.0)
MCV: 91.2 fL (ref 80.0–100.0)
Platelets: 175 10*3/uL (ref 150–400)
RBC: 3.41 MIL/uL — ABNORMAL LOW (ref 3.87–5.11)
RDW: 11.8 % (ref 11.5–15.5)
WBC: 7.9 10*3/uL (ref 4.0–10.5)
nRBC: 0 % (ref 0.0–0.2)

## 2018-08-22 LAB — HIV ANTIBODY (ROUTINE TESTING W REFLEX): HIV Screen 4th Generation wRfx: NONREACTIVE

## 2018-08-22 LAB — LACTIC ACID, PLASMA: Lactic Acid, Venous: 1 mmol/L (ref 0.5–1.9)

## 2018-08-22 LAB — URINE CULTURE: Culture: 80000 — AB

## 2018-08-22 LAB — CORTISOL: Cortisol, Plasma: 9.2 ug/dL

## 2018-08-22 MED ORDER — LACTATED RINGERS IV BOLUS
1000.0000 mL | Freq: Once | INTRAVENOUS | Status: AC
  Administered 2018-08-22: 1000 mL via INTRAVENOUS

## 2018-08-22 MED ORDER — POTASSIUM CHLORIDE CRYS ER 20 MEQ PO TBCR
40.0000 meq | EXTENDED_RELEASE_TABLET | Freq: Two times a day (BID) | ORAL | Status: DC
  Administered 2018-08-22 (×2): 40 meq via ORAL
  Filled 2018-08-22 (×2): qty 2

## 2018-08-22 NOTE — Progress Notes (Signed)
On call MD notified again just now (4th time) regarding BP.  3 boluses have already been given per his previous orders. Patient's BP will go up slightly and then decline.  Continuing to monitor patient closely. Roderick Pee

## 2018-08-22 NOTE — Progress Notes (Signed)
PROGRESS NOTE    Shelly Petty  IRJ:[id number] DOB: 08/25/1995 DOA: 08/21/2018 PCP: [place] Knapp, MD    Brief Narrative:   23 year old patient with history of cyclic vomiting syndrome, anxiety, recurrent hospitalizations for nausea vomiting who came back to the hospital after discharging a day ago where she was treated symptomatically.  Patient does have diagnosis of cyclical vomiting syndrome.  She takes Protonix, Ativan and Hydroxyzine as needed.  Patient has very inconsistent symptoms, they may come anytime without any exacerbating factors.  Assessment & Plan:   Principal Problem:   Intractable nausea and vomiting Active Problems:   Anxiety state   Cyclical vomiting   Acute lower UTI   IBS (irritable bowel syndrome)   Hypokalemia   Leukocytosis  Hypotension/clinical dehydration with intractable nausea vomiting: No evidence of infection.  Lactic acid normal.  WBC normalized with fluid.  Cortisol a.m. level is normal. Blood pressures responded appropriately with IV fluid resuscitation. Continue maintenance IV fluids.  On clear liquid diet as tolerated. Adequate nausea medications, Benadryl and Ativan for acute exacerbation of her cyclical vomiting syndrome.  Acute UTI: Suspect present on admission: Apparently patient has no UTI symptoms.  She had low-grade fever last night with no significance. Final cultures grew 80,000 colonies of MSSA.  Patient denies any urinary symptoms.  She had received 3 days of oral amoxicillin.  With no ongoing symptoms, will not treat with any antibiotics.  Hypokalemia: With ongoing nausea and vomiting.  Will replace aggressively and monitor levels.   DVT prophylaxis: Lovenox. Code Status: Full code. Family Communication: No family at bedside. Disposition Plan: Home.  Anticipate tomorrow.   Consultants:   None.  Procedures:   None.  Antimicrobials:   Rocephin 1 g.  Received 1 dose on 08/21/2018   Subjective: Patient was seen and  examined.  She had low blood pressures overnight and was given 4 L of normal saline.  Currently denies any active vomiting but feels nauseated.  No bowel movement since admission.  Denies any abdominal pain.  She has no dysuria, frequency or burning.  She was menstruating when they collected her urine samples.  Objective: Vitals:   08/22/18 0545 08/22/18 0743 08/22/18 0800 08/22/18 1002  BP: (!) 81/40 (!) 83/41 (!) 88/44 (!) 101/52  Pulse: (!) 46 (!) 48 (!) 48 77  Resp: 18 16  16   Temp: 97.9 F (36.6 C)   98.1 F (36.7 C)  TempSrc: Oral   Oral  SpO2: 99% 99%  99%  Weight:      Height:        Intake/Output Summary (Last 24 hours) at 08/22/2018 1123 Last data filed at 08/22/2018 1049 Gross per 24 hour  Intake 5078.85 ml  Output 2500 ml  Net 2578.85 ml   Filed Weights   08/21/18 1049 08/21/18 1411  Weight: 62.2 kg 62.3 kg    Examination:  General exam: Appears calm and comfortable  Respiratory system: Clear to auscultation. Respiratory effort normal. Cardiovascular system: S1 & S2 heard, RRR. No JVD, murmurs, rubs, gallops or clicks. No pedal edema. Gastrointestinal system: Abdomen is nondistended, soft and nontender. No organomegaly or masses felt. Normal bowel sounds heard. Central nervous system: Alert and oriented. No focal neurological deficits. Extremities: Symmetric 5 x 5 power. Skin: No rashes, lesions or ulcers Psychiatry: Judgement and insight appear normal. Mood & affect appropriate.     Data Reviewed: I have personally reviewed following labs and imaging studies  CBC: Recent Labs  Lab 08/19/18 1633 08/20/18 0328 08/21/18 1059  08/22/18 0516  WBC 12.4* 10.6* 10.7* 7.9  NEUTROABS 10.1*  --   --   --   HGB 13.6 11.3* 12.7 10.3*  HCT 41.8 35.1* 38.5 31.1*  MCV 92.5 93.4 92.5 91.2  PLT 248 215 177 268   Basic Metabolic Panel: Recent Labs  Lab 08/19/18 1633 08/20/18 0328 08/20/18 0830 08/21/18 1059 08/22/18 0516  NA 137 136  --  140 139  K 3.6 3.7   --  3.4* 2.8*  CL 104 108  --  108 108  CO2 22 17*  --  22 23  GLUCOSE 101* 103*  --  99 93  BUN 6 6  --  5* 5*  CREATININE 0.59 0.52  --  0.63 0.60  CALCIUM 9.7 8.8*  --  9.5 8.6*  MG  --   --  1.9  --   --    GFR: Estimated Creatinine Clearance: 89.7 mL/min (by C-G formula based on SCr of 0.6 mg/dL). Liver Function Tests: Recent Labs  Lab 08/19/18 1633  AST 23  ALT 15  ALKPHOS 47  BILITOT 0.7  PROT 8.4*  ALBUMIN 5.4*   Recent Labs  Lab 08/19/18 1633  LIPASE 32   No results for input(s): AMMONIA in the last 168 hours. Coagulation Profile: No results for input(s): INR, PROTIME in the last 168 hours. Cardiac Enzymes: No results for input(s): CKTOTAL, CKMB, CKMBINDEX, TROPONINI in the last 168 hours. BNP (last 3 results) No results for input(s): PROBNP in the last 8760 hours. HbA1C: No results for input(s): HGBA1C in the last 72 hours. CBG: No results for input(s): GLUCAP in the last 168 hours. Lipid Profile: No results for input(s): CHOL, HDL, LDLCALC, TRIG, CHOLHDL, LDLDIRECT in the last 72 hours. Thyroid Function Tests: No results for input(s): TSH, T4TOTAL, FREET4, T3FREE, THYROIDAB in the last 72 hours. Anemia Panel: No results for input(s): VITAMINB12, FOLATE, FERRITIN, TIBC, IRON, RETICCTPCT in the last 72 hours. Sepsis Labs: Recent Labs  Lab 08/22/18 0847  LATICACIDVEN 1.0    Recent Results (from the past 240 hour(s))  Urine culture     Status: Abnormal   Collection Time: 08/19/18  4:33 PM  Result Value Ref Range Status   Specimen Description   Final    URINE, CLEAN CATCH Performed at [facility], [address] [place], [place] [address]    Special Requests   Final    NONE Performed at [facility], [address] [place], [place] [address]    Culture 80,000 COLONIES/mL STAPHYLOCOCCUS AUREUS (A)  Final   Report Status 08/22/2018 FINAL  Final   Organism ID, Bacteria STAPHYLOCOCCUS AUREUS (A)  Final      Susceptibility    Staphylococcus aureus - MIC*    CIPROFLOXACIN <=0.5 SENSITIVE Sensitive     GENTAMICIN <=0.5 SENSITIVE Sensitive     NITROFURANTOIN 32 SENSITIVE Sensitive     OXACILLIN <=0.25 SENSITIVE Sensitive     TETRACYCLINE <=1 SENSITIVE Sensitive     VANCOMYCIN <=0.5 SENSITIVE Sensitive     TRIMETH/SULFA <=10 SENSITIVE Sensitive     CLINDAMYCIN <=0.25 SENSITIVE Sensitive     RIFAMPIN <=0.5 SENSITIVE Sensitive     Inducible Clindamycin NEGATIVE Sensitive     * 80,000 COLONIES/mL STAPHYLOCOCCUS AUREUS         Radiology Studies: No results found.      Scheduled Meds: . enoxaparin (LOVENOX) injection  40 mg Subcutaneous Q24H  . pantoprazole (PROTONIX) IV  40 mg Intravenous Q24H  .  potassium chloride  40 mEq Oral BID  . sodium chloride flush  3 mL Intravenous Q12H   Continuous Infusions: . dextrose 5 % and 0.45 % NaCl with KCl 20 mEq/L 100 mL/hr at 08/22/18 0639     LOS: 0 days    Time spent: 25 minutes    Barb Merino, MD [facility] Pager [phone]  If 7PM-7AM, please contact night-coverage [email or web address] Password [facility] 08/22/2018, 11:23 AM

## 2018-08-23 ENCOUNTER — Inpatient Hospital Stay (HOSPITAL_COMMUNITY): Payer: 59

## 2018-08-23 LAB — BASIC METABOLIC PANEL
Anion gap: 7 (ref 5–15)
BUN: <5 mg/dL — ABNORMAL LOW (ref 6–20)
CO2: 21 mmol/L — ABNORMAL LOW (ref 22–32)
Calcium: 8.6 mg/dL — ABNORMAL LOW (ref 8.9–10.3)
Chloride: 111 mmol/L (ref 98–111)
Creatinine, Ser: 0.55 mg/dL (ref 0.44–1.00)
GFR calc non Af Amer: >60 mL/min (ref >60)
Glucose, Bld: 97 mg/dL (ref 70–99)
Potassium: 3.7 mmol/L (ref 3.5–5.1)
SODIUM: 139 mmol/L (ref 135–145)

## 2018-08-23 LAB — CBC WITH DIFFERENTIAL/PLATELET
Abs Immature Granulocytes: 0.01 10*3/uL (ref 0.00–0.07)
Basophils Absolute: 0 10*3/uL (ref 0.0–0.1)
Basophils Relative: 0 %
Eosinophils Absolute: 0 10*3/uL (ref 0.0–0.5)
Eosinophils Relative: 1 %
HCT: 31.8 % — ABNORMAL LOW (ref 36.0–46.0)
Hemoglobin: 10.3 g/dL — ABNORMAL LOW (ref 12.0–15.0)
Immature Granulocytes: 0 %
Lymphocytes Relative: 46 %
Lymphs Abs: 2.6 10*3/uL (ref 0.7–4.0)
MCH: 30.4 pg (ref 26.0–34.0)
MCHC: 32.4 g/dL (ref 30.0–36.0)
MCV: 93.8 fL (ref 80.0–100.0)
Monocytes Absolute: 0.6 10*3/uL (ref 0.1–1.0)
Monocytes Relative: 10 %
Neutro Abs: 2.5 10*3/uL (ref 1.7–7.7)
Neutrophils Relative %: 43 %
Platelets: 170 10*3/uL (ref 150–400)
RBC: 3.39 MIL/uL — ABNORMAL LOW (ref 3.87–5.11)
RDW: 11.9 % (ref 11.5–15.5)
WBC: 5.7 10*3/uL (ref 4.0–10.5)
nRBC: 0 % (ref 0.0–0.2)

## 2018-08-23 MED ORDER — SODIUM CHLORIDE (PF) 0.9 % IJ SOLN
INTRAMUSCULAR | Status: AC
  Filled 2018-08-23: qty 50

## 2018-08-23 MED ORDER — IOHEXOL 300 MG/ML  SOLN
30.0000 mL | Freq: Once | INTRAMUSCULAR | Status: DC | PRN
  Administered 2018-08-23: 30 mL via ORAL
  Filled 2018-08-23: qty 30

## 2018-08-23 MED ORDER — PANTOPRAZOLE SODIUM 40 MG IV SOLR
40.0000 mg | Freq: Two times a day (BID) | INTRAVENOUS | Status: DC
  Administered 2018-08-23 – 2018-08-24 (×3): 40 mg via INTRAVENOUS
  Filled 2018-08-23 (×3): qty 40

## 2018-08-23 MED ORDER — DICYCLOMINE HCL 10 MG/ML IM SOLN
10.0000 mg | Freq: Once | INTRAMUSCULAR | Status: AC
  Administered 2018-08-23: 10 mg via INTRAMUSCULAR
  Filled 2018-08-23: qty 1

## 2018-08-23 MED ORDER — SODIUM CHLORIDE 0.9 % IV BOLUS
500.0000 mL | Freq: Once | INTRAVENOUS | Status: AC
  Administered 2018-08-23: 500 mL via INTRAVENOUS

## 2018-08-23 MED ORDER — HYDROXYZINE HCL 50 MG/ML IM SOLN
25.0000 mg | Freq: Four times a day (QID) | INTRAMUSCULAR | Status: DC | PRN
  Administered 2018-08-23: 25 mg via INTRAMUSCULAR
  Filled 2018-08-23 (×2): qty 0.5

## 2018-08-23 MED ORDER — IOHEXOL 300 MG/ML  SOLN
100.0000 mL | Freq: Once | INTRAMUSCULAR | Status: AC | PRN
  Administered 2018-08-23: 100 mL via INTRAVENOUS

## 2018-08-23 MED ORDER — HYDROXYZINE HCL 10 MG PO TABS
10.0000 mg | ORAL_TABLET | Freq: Four times a day (QID) | ORAL | Status: DC | PRN
  Filled 2018-08-23 (×2): qty 1

## 2018-08-23 MED ORDER — PROCHLORPERAZINE EDISYLATE 10 MG/2ML IJ SOLN
10.0000 mg | Freq: Four times a day (QID) | INTRAMUSCULAR | Status: DC | PRN
  Administered 2018-08-23: 10 mg via INTRAVENOUS
  Filled 2018-08-23: qty 2

## 2018-08-23 NOTE — Progress Notes (Signed)
MD made aware that pt is unable to drink contrast solution for CT scan. No new orders placed. Will continue to monitor.

## 2018-08-23 NOTE — Progress Notes (Signed)
PROGRESS NOTE    Shelly Petty  LDJ:[id number] DOB: 07-04-1995 DOA: 08/21/2018 PCP: Shawnee Knapp, MD    Brief Narrative:   23 year old patient with history of cyclic vomiting syndrome, anxiety, recurrent hospitalizations for nausea vomiting who came back to the hospital after discharging a day ago where she was treated symptomatically.  Patient does have diagnosis of cyclical vomiting syndrome.  She takes Protonix, Ativan and Hydroxyzine as needed.   Assessment & Plan:   Principal Problem:   Intractable nausea and vomiting Active Problems:   Anxiety state   Cyclical vomiting   Acute lower UTI   IBS (irritable bowel syndrome)   Hypokalemia   Leukocytosis  Hypotension/clinical dehydration with intractable nausea vomiting: No evidence of infection.  Lactic acid normal.  WBC normalized with fluid.  Cortisol a.m. level is normal. Blood pressures responded appropriately with IV fluid resuscitation. Continue maintenance IV fluids.  On clear liquid diet as tolerated. Adequate nausea medications, Benadryl and Ativan for acute exacerbation of her cyclical vomiting syndrome.  Acute UTI: Suspected present on admission: rules out.  Apparently patient has no UTI symptoms.  She had low-grade fever last night with no significance. Final cultures grew 80,000 colonies of MSSA.  Patient denies any urinary symptoms.  She had received 3 days of oral amoxicillin.  With no ongoing symptoms, will not treat with any antibiotics.  Hypokalemia: With ongoing nausea and vomiting.  Replaced and normalized.    DVT prophylaxis: Lovenox. Code Status: Full code. Family Communication: No family at bedside. Partner was on the phone.  Disposition Plan: Home.  Anticipate tomorrow.   Consultants:   None.  Procedures:   None.  Antimicrobials:   Rocephin 1 g.  Received 1 dose on 08/21/2018   Subjective: Patient was seen and examined.  Patient continues to have intractable nausea.  She thinks her abdomen  is having seizures.  Unable to eat or drink anything.  Potassium tablet might have aggravated the condition as per patient.  Objective: Vitals:   08/22/18 1805 08/22/18 2159 08/23/18 0121 08/23/18 1216  BP: (!) 98/56 108/66 103/79 (!) 132/55  Pulse: 87 69 66   Resp: 16 18 18 18   Temp: 98.7 F (37.1 C) (!) 97.5 F (36.4 C) 97.9 F (36.6 C) 98.5 F (36.9 C)  TempSrc: Oral Oral Oral Oral  SpO2: 99% 100% 98% 100%  Weight:      Height:        Intake/Output Summary (Last 24 hours) at 08/23/2018 1256 Last data filed at 08/23/2018 1042 Gross per 24 hour  Intake 1531.26 ml  Output 3800 ml  Net -2268.74 ml   Filed Weights   08/21/18 1049 08/21/18 1411  Weight: 62.2 kg 62.3 kg    Examination:  General exam: Appears calm and comfortable, anxious Respiratory system: Clear to auscultation. Respiratory effort normal. Cardiovascular system: S1 & S2 heard, RRR. No JVD, murmurs, rubs, gallops or clicks. No pedal edema. Gastrointestinal system: Abdomen is nondistended, soft and nontender. No organomegaly or masses felt. Normal bowel sounds heard. Central nervous system: Alert and oriented. No focal neurological deficits. Extremities: Symmetric 5 x 5 power. Skin: No rashes, lesions or ulcers Psychiatry: Judgement and insight appear normal. Mood & affect appropriate.     Data Reviewed: I have personally reviewed following labs and imaging studies  CBC: Recent Labs  Lab 08/19/18 1633 08/20/18 0328 08/21/18 1059 08/22/18 0516 08/23/18 0444  WBC 12.4* 10.6* 10.7* 7.9 5.7  NEUTROABS 10.1*  --   --   --  2.5  HGB 13.6 11.3* 12.7 10.3* 10.3*  HCT 41.8 35.1* 38.5 31.1* 31.8*  MCV 92.5 93.4 92.5 91.2 93.8  PLT 248 215 177 175 010   Basic Metabolic Panel: Recent Labs  Lab 08/19/18 1633 08/20/18 0328 08/20/18 0830 08/21/18 1059 08/22/18 0516 08/23/18 0444  NA 137 136  --  140 139 139  K 3.6 3.7  --  3.4* 2.8* 3.7  CL 104 108  --  108 108 111  CO2 22 17*  --  22 23 21*   GLUCOSE 101* 103*  --  99 93 97  BUN 6 6  --  5* 5* <5*  CREATININE 0.59 0.52  --  0.63 0.60 0.55  CALCIUM 9.7 8.8*  --  9.5 8.6* 8.6*  MG  --   --  1.9  --   --   --    GFR: Estimated Creatinine Clearance: 89.7 mL/min (by C-G formula based on SCr of 0.55 mg/dL). Liver Function Tests: Recent Labs  Lab 08/19/18 1633  AST 23  ALT 15  ALKPHOS 47  BILITOT 0.7  PROT 8.4*  ALBUMIN 5.4*   Recent Labs  Lab 08/19/18 1633  LIPASE 32   No results for input(s): AMMONIA in the last 168 hours. Coagulation Profile: No results for input(s): INR, PROTIME in the last 168 hours. Cardiac Enzymes: No results for input(s): CKTOTAL, CKMB, CKMBINDEX, TROPONINI in the last 168 hours. BNP (last 3 results) No results for input(s): PROBNP in the last 8760 hours. HbA1C: No results for input(s): HGBA1C in the last 72 hours. CBG: No results for input(s): GLUCAP in the last 168 hours. Lipid Profile: No results for input(s): CHOL, HDL, LDLCALC, TRIG, CHOLHDL, LDLDIRECT in the last 72 hours. Thyroid Function Tests: No results for input(s): TSH, T4TOTAL, FREET4, T3FREE, THYROIDAB in the last 72 hours. Anemia Panel: No results for input(s): VITAMINB12, FOLATE, FERRITIN, TIBC, IRON, RETICCTPCT in the last 72 hours. Sepsis Labs: Recent Labs  Lab 08/22/18 0847  LATICACIDVEN 1.0    Recent Results (from the past 240 hour(s))  Urine culture     Status: Abnormal   Collection Time: 08/19/18  4:33 PM  Result Value Ref Range Status   Specimen Description   Final    URINE, CLEAN CATCH Performed at [facility], [address] [place], [place] [address]    Special Requests   Final    NONE Performed at [facility], [address] [place], [place] [address]    Culture 80,000 COLONIES/mL STAPHYLOCOCCUS AUREUS (A)  Final   Report Status 08/22/2018 FINAL  Final   Organism ID, Bacteria STAPHYLOCOCCUS AUREUS (A)  Final      Susceptibility   Staphylococcus aureus - MIC*     CIPROFLOXACIN <=0.5 SENSITIVE Sensitive     GENTAMICIN <=0.5 SENSITIVE Sensitive     NITROFURANTOIN 32 SENSITIVE Sensitive     OXACILLIN <=0.25 SENSITIVE Sensitive     TETRACYCLINE <=1 SENSITIVE Sensitive     VANCOMYCIN <=0.5 SENSITIVE Sensitive     TRIMETH/SULFA <=10 SENSITIVE Sensitive     CLINDAMYCIN <=0.25 SENSITIVE Sensitive     RIFAMPIN <=0.5 SENSITIVE Sensitive     Inducible Clindamycin NEGATIVE Sensitive     * 80,000 COLONIES/mL STAPHYLOCOCCUS AUREUS         Radiology Studies: No results found.      Scheduled Meds: . enoxaparin (LOVENOX) injection  40 mg Subcutaneous Q24H  . pantoprazole (PROTONIX) IV  40 mg Intravenous Q12H  . sodium chloride flush  3 mL Intravenous Q12H   Continuous Infusions: . dextrose 5 % and 0.45 % NaCl with KCl 20 mEq/L 100 mL/hr at 08/23/18 0615     LOS: 1 day    Time spent: 25 minutes    Barb Merino, MD [facility] Pager [phone]  If 7PM-7AM, please contact night-coverage [email or web address] Password [facility] 08/23/2018, 12:56 PM

## 2018-08-23 NOTE — Progress Notes (Signed)
BP soft tonight again.  Patient dropped saturday night (all night) and received 4 boluses. MD was not concerned in am. I did notify night NP tonight to inform her again of low BP.Roderick Pee

## 2018-08-24 ENCOUNTER — Encounter (HOSPITAL_COMMUNITY): Payer: Self-pay | Admitting: Physician Assistant

## 2018-08-24 ENCOUNTER — Inpatient Hospital Stay (HOSPITAL_COMMUNITY): Payer: 59

## 2018-08-24 MED ORDER — PANTOPRAZOLE SODIUM 40 MG IV SOLR
40.0000 mg | Freq: Two times a day (BID) | INTRAVENOUS | Status: DC
  Administered 2018-08-24 – 2018-08-25 (×2): 40 mg via INTRAVENOUS
  Filled 2018-08-24 (×2): qty 40
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
## 2018-08-24 NOTE — Progress Notes (Signed)
PROGRESS NOTE    Shelly Petty  BPZ:[id number] DOB: 10-22-1995 DOA: 08/21/2018 PCP: Shawnee Knapp, MD    Brief Narrative:   23 year old patient with history of cyclic vomiting syndrome, anxiety, recurrent hospitalizations for nausea vomiting who came back to the hospital after discharging a day ago where she was treated symptomatically.  Patient does have diagnosis of cyclical vomiting syndrome.  She takes Protonix, Ativan and Hydroxyzine as needed. Continues tp have symptoms.  Assessment & Plan:   Principal Problem:   Intractable nausea and vomiting Active Problems:   Anxiety state   Cyclical vomiting   Acute lower UTI   IBS (irritable bowel syndrome)   Hypokalemia   Leukocytosis  Hypotension/clinical dehydration with intractable nausea vomiting: No evidence of infection. Lactic acid normal.  WBC normalized with fluid.  Cortisol a.m. level is normal. Blood pressures responded appropriately with IV fluid resuscitation. Her blood pressure is low sometimes at night which could be normal for her. Continue maintenance IV fluids.  On clears.  But hardly able to take it. Adequate nausea medications, Benadryl and Ativan for acute exacerbation of her cyclical vomiting syndrome. Due to persistent symptoms, I discussed with her gastroenterologist and they will see patient in follow-up.  Acute UTI: Suspected present on admission: ruled out.  Apparently patient has no UTI symptoms.  She had low-grade fever last night with no significance. Final cultures grew 80,000 colonies of MSSA.  Patient denies any urinary symptoms.  She had received 3 days of oral amoxicillin.  With no ongoing symptoms, will not treat with any antibiotics.  Hypokalemia: With ongoing nausea and vomiting.  Replaced and normalized.    DVT prophylaxis: Lovenox. Code Status: Full code. Family Communication: No family at bedside. Partner was on the phone.  Disposition Plan: Home.  Anticipate tomorrow.   Consultants:    None.  Procedures:   None.  Antimicrobials:   Rocephin 1 g.  Received 1 dose on 08/21/2018   Subjective: Patient was seen and examined.  Patient continues to have intractable nausea.  She thinks her abdomen is having seizures and wondering about having an EEG to find out whether she has abdominal seizure. She wants her Protonix as soon as she wakes up in the morning.  Objective: Vitals:   08/23/18 2041 08/23/18 2253 08/24/18 0032 08/24/18 0533  BP: (!) 87/52 (!) 84/44 (!) 91/53 (!) 90/48  Pulse: 97  63 69  Resp: 18   16  Temp: 98 F (36.7 C)   97.7 F (36.5 C)  TempSrc: Oral   Oral  SpO2: 99%   99%  Weight:      Height:        Intake/Output Summary (Last 24 hours) at 08/24/2018 1138 Last data filed at 08/24/2018 0535 Gross per 24 hour  Intake 1824.99 ml  Output 2300 ml  Net -475.01 ml   Filed Weights   08/21/18 1049 08/21/18 1411  Weight: 62.2 kg 62.3 kg    Examination:  General exam: Appears calm and comfortable, anxious. She looks fairly comfortable to the extent of her symptoms. Respiratory system: Clear to auscultation. Respiratory effort normal. Cardiovascular system: S1 & S2 heard, RRR. No JVD, murmurs, rubs, gallops or clicks. No pedal edema. Gastrointestinal system: Abdomen is nondistended, soft and nontender. No organomegaly or masses felt. Normal bowel sounds heard. Central nervous system: Alert and oriented. No focal neurological deficits. Extremities: Symmetric 5 x 5 power. Skin: No rashes, lesions or ulcers Psychiatry: Judgement and insight appear normal. Mood & affect appropriate.  Data Reviewed: I have personally reviewed following labs and imaging studies  CBC: Recent Labs  Lab 08/19/18 1633 08/20/18 0328 08/21/18 1059 08/22/18 0516 08/23/18 0444  WBC 12.4* 10.6* 10.7* 7.9 5.7  NEUTROABS 10.1*  --   --   --  2.5  HGB 13.6 11.3* 12.7 10.3* 10.3*  HCT 41.8 35.1* 38.5 31.1* 31.8*  MCV 92.5 93.4 92.5 91.2 93.8  PLT 248 215 177 175  027   Basic Metabolic Panel: Recent Labs  Lab 08/19/18 1633 08/20/18 0328 08/20/18 0830 08/21/18 1059 08/22/18 0516 08/23/18 0444  NA 137 136  --  140 139 139  K 3.6 3.7  --  3.4* 2.8* 3.7  CL 104 108  --  108 108 111  CO2 22 17*  --  22 23 21*  GLUCOSE 101* 103*  --  99 93 97  BUN 6 6  --  5* 5* <5*  CREATININE 0.59 0.52  --  0.63 0.60 0.55  CALCIUM 9.7 8.8*  --  9.5 8.6* 8.6*  MG  --   --  1.9  --   --   --    GFR: Estimated Creatinine Clearance: 89.7 mL/min (by C-G formula based on SCr of 0.55 mg/dL). Liver Function Tests: Recent Labs  Lab 08/19/18 1633  AST 23  ALT 15  ALKPHOS 47  BILITOT 0.7  PROT 8.4*  ALBUMIN 5.4*   Recent Labs  Lab 08/19/18 1633  LIPASE 32   No results for input(s): AMMONIA in the last 168 hours. Coagulation Profile: No results for input(s): INR, PROTIME in the last 168 hours. Cardiac Enzymes: No results for input(s): CKTOTAL, CKMB, CKMBINDEX, TROPONINI in the last 168 hours. BNP (last 3 results) No results for input(s): PROBNP in the last 8760 hours. HbA1C: No results for input(s): HGBA1C in the last 72 hours. CBG: No results for input(s): GLUCAP in the last 168 hours. Lipid Profile: No results for input(s): CHOL, HDL, LDLCALC, TRIG, CHOLHDL, LDLDIRECT in the last 72 hours. Thyroid Function Tests: No results for input(s): TSH, T4TOTAL, FREET4, T3FREE, THYROIDAB in the last 72 hours. Anemia Panel: No results for input(s): VITAMINB12, FOLATE, FERRITIN, TIBC, IRON, RETICCTPCT in the last 72 hours. Sepsis Labs: Recent Labs  Lab 08/22/18 0847  LATICACIDVEN 1.0    Recent Results (from the past 240 hour(s))  Urine culture     Status: Abnormal   Collection Time: 08/19/18  4:33 PM  Result Value Ref Range Status   Specimen Description   Final    URINE, CLEAN CATCH Performed at [facility], [address] [place], [place] [address]    Special Requests   Final    NONE Performed at [facility], [address] [place], [place] [address]    Culture 80,000 COLONIES/mL STAPHYLOCOCCUS AUREUS (A)  Final   Report Status 08/22/2018 FINAL  Final   Organism ID, Bacteria STAPHYLOCOCCUS AUREUS (A)  Final      Susceptibility   Staphylococcus aureus - MIC*    CIPROFLOXACIN <=0.5 SENSITIVE Sensitive     GENTAMICIN <=0.5 SENSITIVE Sensitive     NITROFURANTOIN 32 SENSITIVE Sensitive     OXACILLIN <=0.25 SENSITIVE Sensitive     TETRACYCLINE <=1 SENSITIVE Sensitive     VANCOMYCIN <=0.5 SENSITIVE Sensitive     TRIMETH/SULFA <=10 SENSITIVE Sensitive     CLINDAMYCIN <=0.25 SENSITIVE Sensitive     RIFAMPIN <=0.5 SENSITIVE Sensitive     Inducible Clindamycin NEGATIVE Sensitive     *  80,000 COLONIES/mL STAPHYLOCOCCUS AUREUS         Radiology Studies: Ct Abdomen Pelvis W Contrast  Result Date: 08/23/2018 CLINICAL DATA:  Abdominal pain with nausea and vomiting. History of cyclical vomiting syndrome. EXAM: CT ABDOMEN AND PELVIS WITH CONTRAST TECHNIQUE: Multidetector CT imaging of the abdomen and pelvis was performed using the standard protocol following bolus administration of intravenous contrast. CONTRAST:  170mL OMNIPAQUE IOHEXOL 300 MG/ML SOLN, 31mL OMNIPAQUE IOHEXOL 300 MG/ML SOLN COMPARISON:  Bilateral breast ultrasound dated May 07, 2018. Right upper quadrant ultrasound dated April 22, 2018. FINDINGS: Lower chest: No acute abnormality. Hepatobiliary: No focal liver abnormality is seen. No gallstones, gallbladder wall thickening, or biliary dilatation. Pancreas: Unremarkable. No pancreatic ductal dilatation or surrounding inflammatory changes. Spleen: Normal in size without focal abnormality. Adrenals/Urinary Tract: Adrenal glands are unremarkable. Kidneys are normal, without renal calculi, focal lesion, or hydronephrosis. Bladder is unremarkable for the degree of distention. Stomach/Bowel: Stomach is within normal limits. Appendix is not definitively identified, but there are no signs of inflammation at  the base of the cecum. No evidence of bowel wall thickening, distention, or inflammatory changes. Vascular/Lymphatic: Narrowing of the left renal vein between the SMA and aorta with dilated the left-sided lumbar vein. No enlarged abdominal or pelvic lymph nodes. Reproductive: Uterus and bilateral adnexa are unremarkable. Menstrual cup in the vagina. Other: Trace free fluid in the pelvis, likely physiologic. No pneumoperitoneum. Musculoskeletal: No acute or significant osseous findings. IMPRESSION: 1.  No acute intra-abdominal process.  No bowel obstruction. 2. Narrowing of the left renal vein between the SMA and aorta with dilated left-sided lumbar vein. Correlate for renal nutcracker syndrome. Electronically Signed   By: Titus Dubin M.D.   On: 08/23/2018 17:49        Scheduled Meds:  enoxaparin (LOVENOX) injection  40 mg Subcutaneous Q24H   pantoprazole (PROTONIX) IV  40 mg Intravenous Q12H   sodium chloride flush  3 mL Intravenous Q12H   Continuous Infusions:  dextrose 5 % and 0.45 % NaCl with KCl 20 mEq/L 100 mL/hr at 08/24/18 0137     LOS: 2 days    Time spent: 25 minutes    Barb Merino, MD [facility] Pager [phone]  If 7PM-7AM, please contact night-coverage [email or web address] Password [facility] 08/24/2018, 11:38 AM

## 2018-08-25 ENCOUNTER — Encounter (HOSPITAL_COMMUNITY)
Admission: EM | Disposition: A | Discharge status: Home / Self Care | Payer: Self-pay | Source: Home / Self Care | Attending: Internal Medicine

## 2018-08-25 ENCOUNTER — Inpatient Hospital Stay (HOSPITAL_COMMUNITY): Payer: 59 | Admitting: Anesthesiology

## 2018-08-25 ENCOUNTER — Encounter (HOSPITAL_COMMUNITY): Payer: Self-pay | Admitting: Anesthesiology

## 2018-08-25 HISTORY — PX: ESOPHAGOGASTRODUODENOSCOPY (EGD) WITH PROPOFOL: SHX5813

## 2018-08-25 HISTORY — PX: BIOPSY: SHX5522

## 2018-08-25 LAB — TSH: TSH: 1.767 u[IU]/mL (ref 0.350–4.500)

## 2018-08-25 LAB — SEDIMENTATION RATE: Sed Rate: 2 mm/hr (ref 0–22)

## 2018-08-25 LAB — C-REACTIVE PROTEIN: CRP: <0.8 mg/dL (ref <1.0)

## 2018-08-25 SURGERY — ESOPHAGOGASTRODUODENOSCOPY (EGD) WITH PROPOFOL
Anesthesia: Monitor Anesthesia Care

## 2018-08-25 MED ORDER — LACTATED RINGERS IV SOLN
INTRAVENOUS | Status: AC | PRN
  Administered 2018-08-25: 1000 mL via INTRAVENOUS

## 2018-08-25 MED ORDER — LIDOCAINE 2% (20 MG/ML) 5 ML SYRINGE
INTRAMUSCULAR | Status: DC | PRN
  Administered 2018-08-25: 100 mg via INTRAVENOUS

## 2018-08-25 MED ORDER — PANTOPRAZOLE SODIUM 40 MG PO TBEC
40.0000 mg | DELAYED_RELEASE_TABLET | Freq: Every day | ORAL | Status: DC
  Administered 2018-08-25: 40 mg via ORAL
  Filled 2018-08-25: qty 1

## 2018-08-25 MED ORDER — PROPOFOL 500 MG/50ML IV EMUL
INTRAVENOUS | Status: DC | PRN
  Administered 2018-08-25: 120 ug/kg/min via INTRAVENOUS

## 2018-08-25 MED ORDER — PROPOFOL 10 MG/ML IV BOLUS
INTRAVENOUS | Status: DC | PRN
  Administered 2018-08-25 (×5): 20 mg via INTRAVENOUS

## 2018-08-25 MED ORDER — DEXAMETHASONE SODIUM PHOSPHATE 10 MG/ML IJ SOLN
INTRAMUSCULAR | Status: DC | PRN
  Administered 2018-08-25: 10 mg via INTRAVENOUS

## 2018-08-25 SURGICAL SUPPLY — 15 items

## 2018-08-25 NOTE — Progress Notes (Signed)
Reviewed discharge paperwork and medication regimen with patient. Patient declined wheelchair, NT escorted patient to main entrance.

## 2018-08-25 NOTE — Discharge Summary (Signed)
Physician Discharge Summary  Shelly Petty HYI:[id number] DOB: 1995-06-18 DOA: 08/21/2018  PCP: Shawnee Knapp, MD  Admit date: 08/21/2018 Discharge date: 08/25/2018  Admitted From: Home.  Disposition: home.   Recommendations for Outpatient Follow-up:  1. Follow up with PCP in 1-2 weeks 2. Please obtain BMP/CBC in one week Please follow up with gastroenterology as needed.   Discharge Condition:stable.  CODE STATUS:full code.  Diet recommendation: Heart Healthy   Brief/Interim Summary:  23 year old patient with history of cyclic vomiting syndrome, anxiety, recurrent hospitalizations for nausea vomiting who came back to the hospital after discharging a day ago where she was treated symptomatically.  Patient does have diagnosis of cyclical vomiting syndrome.  She takes Protonix, Ativan and Hydroxyzine as needed.  Discharge Diagnoses:  Principal Problem:   Intractable nausea and vomiting Active Problems:   Anxiety state   Cyclical vomiting   Acute lower UTI   IBS (irritable bowel syndrome)   Hypokalemia   Leukocytosis  Hypotension/clinical dehydration with intractable nausea vomiting: No evidence of infection. Lactic acid normal.  WBC normalized with fluid.  Cortisol a.m. level is normal. Blood pressures responded appropriately with IV fluid resuscitation. Her blood pressure is low sometimes at night which could be normal for her. Adequate nausea medications, Benadryl and Ativan for acute exacerbation of her cyclical vomiting syndrome. Due to persistent symptoms,  discussed with her gastroenterologist who saw the patient and she underwent EGD, which was essentially normal.   Acute UTI: Suspected present on admission: ruled out.  Apparently patient has no UTI symptoms.  She had low-grade fever last night with no significance. Final cultures grew 80,000 colonies of MSSA.  Patient denies any urinary symptoms.  She had received 3 days of oral amoxicillin.  With no ongoing symptoms, will  not treat with any antibiotics.  Hypokalemia: With ongoing nausea and vomiting.  Replaced and normalized.    Discharge Instructions  Discharge Instructions    Diet - low sodium heart healthy   Complete by:  As directed    Discharge instructions   Complete by:  As directed    Follow up with gastroenterology as recommended for the biopsy results.     Allergies as of 08/25/2018      Reactions   Prozac [fluoxetine Hcl] Nausea And Vomiting   Lexapro [escitalopram Oxalate] Nausea And Vomiting   Pt does not do well with SSRI medications   Serotonin Nausea And Vomiting      Medication List    TAKE these medications   acetaminophen 325 MG tablet Commonly known as:  TYLENOL Take 2 tablets (650 mg total) by mouth every 6 (six) hours as needed for mild pain (or Fever >/= 101).   dicyclomine 20 MG tablet Commonly known as:  BENTYL Take 20 mg by mouth 2 (two) times daily.   hydrOXYzine 10 MG tablet Commonly known as:  ATARAX/VISTARIL Take 1-2 tablets (10-20 mg total) by mouth every 4 (four) hours as needed for anxiety.   LORazepam 1 MG tablet Commonly known as:  ATIVAN Take 1 tablet (1 mg total) by mouth 3 (three) times daily as needed for anxiety.   pantoprazole 40 MG tablet Commonly known as:  PROTONIX Take 1 tablet (40 mg total) by mouth daily.   promethazine 25 MG suppository Commonly known as:  PHENERGAN Place 25 mg rectally every 6 (six) hours as needed for nausea or vomiting. What changed:  Another medication with the same name was removed. Continue taking this medication, and follow the directions you see here.  vitamin C 1000 MG tablet Take 1,000 mg by mouth daily.       Allergies  Allergen Reactions  . Prozac [Fluoxetine Hcl] Nausea And Vomiting  . Lexapro [Escitalopram Oxalate] Nausea And Vomiting    Pt does not do well with SSRI medications  . Serotonin Nausea And Vomiting    Consultations:  GASTROENTEROLOGY UNDERWENT EGD     Procedures/Studies: US Abdomen Complete  Result Date: 08/24/2018 CLINICAL DATA:  Abdominal pain with nausea and vomiting. EXAM: ABDOMEN ULTRASOUND COMPLETE COMPARISON:  CT on 08/23/2018 FINDINGS: Gallbladder: No gallstones or wall thickening visualized. No sonographic Murphy sign noted by sonographer. Common bile duct: Diameter: 4 mm, within normal limits. Liver: No focal lesion identified. Within normal limits in parenchymal echogenicity. Portal vein is patent on color Doppler imaging with normal direction of blood flow towards the liver. IVC: No abnormality visualized. Pancreas: Visualized portion unremarkable. Spleen: Size and appearance within normal limits. Right Kidney: Length: 10.6 cm. Echogenicity within normal limits. No mass or hydronephrosis visualized. Left Kidney: Length: 11.8 cm. Echogenicity within normal limits. No mass or hydronephrosis visualized. Abdominal aorta: No aneurysm visualized. Other findings: None. IMPRESSION: Negative abdomen ultrasound. Electronically Signed   By: Earle Gell M.D.   On: 08/24/2018 19:50   Ct Abdomen Pelvis W Contrast  Result Date: 08/23/2018 CLINICAL DATA:  Abdominal pain with nausea and vomiting. History of cyclical vomiting syndrome. EXAM: CT ABDOMEN AND PELVIS WITH CONTRAST TECHNIQUE: Multidetector CT imaging of the abdomen and pelvis was performed using the standard protocol following bolus administration of intravenous contrast. CONTRAST:  138mL OMNIPAQUE IOHEXOL 300 MG/ML SOLN, 87mL OMNIPAQUE IOHEXOL 300 MG/ML SOLN COMPARISON:  Bilateral breast ultrasound dated May 07, 2018. Right upper quadrant ultrasound dated April 22, 2018. FINDINGS: Lower chest: No acute abnormality. Hepatobiliary: No focal liver abnormality is seen. No gallstones, gallbladder wall thickening, or biliary dilatation. Pancreas: Unremarkable. No pancreatic ductal dilatation or surrounding inflammatory changes. Spleen: Normal in size without focal abnormality.  Adrenals/Urinary Tract: Adrenal glands are unremarkable. Kidneys are normal, without renal calculi, focal lesion, or hydronephrosis. Bladder is unremarkable for the degree of distention. Stomach/Bowel: Stomach is within normal limits. Appendix is not definitively identified, but there are no signs of inflammation at the base of the cecum. No evidence of bowel wall thickening, distention, or inflammatory changes. Vascular/Lymphatic: Narrowing of the left renal vein between the SMA and aorta with dilated the left-sided lumbar vein. No enlarged abdominal or pelvic lymph nodes. Reproductive: Uterus and bilateral adnexa are unremarkable. Menstrual cup in the vagina. Other: Trace free fluid in the pelvis, likely physiologic. No pneumoperitoneum. Musculoskeletal: No acute or significant osseous findings. IMPRESSION: 1.  No acute intra-abdominal process.  No bowel obstruction. 2. Narrowing of the left renal vein between the SMA and aorta with dilated left-sided lumbar vein. Correlate for renal nutcracker syndrome. Electronically Signed   By: Titus Dubin M.D.   On: 08/23/2018 17:49    (Echo, Carotid, EGD, Colonoscopy, ERCP)    Subjective:  NO NEW COMPLAINTS.  Discharge Exam: Vitals:   08/25/18 1100 08/25/18 1105  BP: (!) 88/47 (!) 99/49  Pulse: 63 64  Resp: 17 12  Temp:    SpO2: 100% 100%   Vitals:   08/25/18 1050 08/25/18 1055 08/25/18 1100 08/25/18 1105  BP: (!) 87/37 (!) 100/43 (!) 88/47 (!) 99/49  Pulse: 62 66 63 64  Resp: 15 18 17 12   Temp: 98 F (36.7 C)     TempSrc: Oral     SpO2: 100% 100% 100% 100%  Weight:      Height:        General: Pt is alert, awake, not in acute distress Cardiovascular: RRR, S1/S2 +, no rubs, no gallops Respiratory: CTA bilaterally, no wheezing, no rhonchi Abdominal: Soft, NT, ND, bowel sounds + Extremities: no edema, no cyanosis    The results of significant diagnostics from this hospitalization (including imaging, microbiology, ancillary and  laboratory) are listed below for reference.     Microbiology: Recent Results (from the past 240 hour(s))  Urine culture     Status: Abnormal   Collection Time: 08/19/18  4:33 PM  Result Value Ref Range Status   Specimen Description   Final    URINE, CLEAN CATCH Performed at [facility], [address] [place], [place] [address]    Special Requests   Final    NONE Performed at [facility], [address] [place], [place] [address]    Culture 80,000 COLONIES/mL STAPHYLOCOCCUS AUREUS (A)  Final   Report Status 08/22/2018 FINAL  Final   Organism ID, Bacteria STAPHYLOCOCCUS AUREUS (A)  Final      Susceptibility   Staphylococcus aureus - MIC*    CIPROFLOXACIN <=0.5 SENSITIVE Sensitive     GENTAMICIN <=0.5 SENSITIVE Sensitive     NITROFURANTOIN 32 SENSITIVE Sensitive     OXACILLIN <=0.25 SENSITIVE Sensitive     TETRACYCLINE <=1 SENSITIVE Sensitive     VANCOMYCIN <=0.5 SENSITIVE Sensitive     TRIMETH/SULFA <=10 SENSITIVE Sensitive     CLINDAMYCIN <=0.25 SENSITIVE Sensitive     RIFAMPIN <=0.5 SENSITIVE Sensitive     Inducible Clindamycin NEGATIVE Sensitive     * 80,000 COLONIES/mL STAPHYLOCOCCUS AUREUS     Labs: BNP (last 3 results) No results for input(s): BNP in the last 8760 hours. Basic Metabolic Panel: Recent Labs  Lab 08/19/18 1633 08/20/18 0328 08/20/18 0830 08/21/18 1059 08/22/18 0516 08/23/18 0444  NA 137 136  --  140 139 139  K 3.6 3.7  --  3.4* 2.8* 3.7  CL 104 108  --  108 108 111  CO2 22 17*  --  22 23 21*  GLUCOSE 101* 103*  --  99 93 97  BUN 6 6  --  5* 5* <5*  CREATININE 0.59 0.52  --  0.63 0.60 0.55  CALCIUM 9.7 8.8*  --  9.5 8.6* 8.6*  MG  --   --  1.9  --   --   --    Liver Function Tests: Recent Labs  Lab 08/19/18 1633  AST 23  ALT 15  ALKPHOS 47  BILITOT 0.7  PROT 8.4*  ALBUMIN 5.4*   Recent Labs  Lab 08/19/18 1633  LIPASE 32   No results for input(s): AMMONIA in the last 168 hours. CBC: Recent Labs  Lab  08/19/18 1633 08/20/18 0328 08/21/18 1059 08/22/18 0516 08/23/18 0444  WBC 12.4* 10.6* 10.7* 7.9 5.7  NEUTROABS 10.1*  --   --   --  2.5  HGB 13.6 11.3* 12.7 10.3* 10.3*  HCT 41.8 35.1* 38.5 31.1* 31.8*  MCV 92.5 93.4 92.5 91.2 93.8  PLT 248 215 177 175 170   Cardiac Enzymes: No results for input(s): CKTOTAL, CKMB, CKMBINDEX, TROPONINI in the last 168 hours. BNP: Invalid input(s): POCBNP CBG: No results for input(s): GLUCAP in the last 168 hours. D-Dimer No results for input(s): DDIMER in the last 72 hours. Hgb A1c No results for input(s): HGBA1C in the last 72 hours. Lipid Profile No  results for input(s): CHOL, HDL, LDLCALC, TRIG, CHOLHDL, LDLDIRECT in the last 72 hours. Thyroid function studies Recent Labs    08/25/18 0510  TSH 1.767   Anemia work up No results for input(s): VITAMINB12, FOLATE, FERRITIN, TIBC, IRON, RETICCTPCT in the last 72 hours. Urinalysis    Component Value Date/Time   COLORURINE STRAW (A) 08/21/2018 1059   APPEARANCEUR CLEAR 08/21/2018 1059   LABSPEC 1.009 08/21/2018 1059   PHURINE 5.0 08/21/2018 1059   GLUCOSEU NEGATIVE 08/21/2018 1059   HGBUR MODERATE (A) 08/21/2018 1059   BILIRUBINUR NEGATIVE 08/21/2018 1059   BILIRUBINUR negative 05/06/2018 1658   KETONESUR 5 (A) 08/21/2018 1059   PROTEINUR NEGATIVE 08/21/2018 1059   UROBILINOGEN 0.2 05/06/2018 1658   NITRITE NEGATIVE 08/21/2018 1059   LEUKOCYTESUR NEGATIVE 08/21/2018 1059   Sepsis Labs Invalid input(s): PROCALCITONIN,  WBC,  LACTICIDVEN Microbiology Recent Results (from the past 240 hour(s))  Urine culture     Status: Abnormal   Collection Time: 08/19/18  4:33 PM  Result Value Ref Range Status   Specimen Description   Final    URINE, CLEAN CATCH Performed at [facility], [address] [place], [place] [address]    Special Requests   Final    NONE Performed at [facility], [address] [place], [place] [address]    Culture 80,000 COLONIES/mL  STAPHYLOCOCCUS AUREUS (A)  Final   Report Status 08/22/2018 FINAL  Final   Organism ID, Bacteria STAPHYLOCOCCUS AUREUS (A)  Final      Susceptibility   Staphylococcus aureus - MIC*    CIPROFLOXACIN <=0.5 SENSITIVE Sensitive     GENTAMICIN <=0.5 SENSITIVE Sensitive     NITROFURANTOIN 32 SENSITIVE Sensitive     OXACILLIN <=0.25 SENSITIVE Sensitive     TETRACYCLINE <=1 SENSITIVE Sensitive     VANCOMYCIN <=0.5 SENSITIVE Sensitive     TRIMETH/SULFA <=10 SENSITIVE Sensitive     CLINDAMYCIN <=0.25 SENSITIVE Sensitive     RIFAMPIN <=0.5 SENSITIVE Sensitive     Inducible Clindamycin NEGATIVE Sensitive     * 80,000 COLONIES/mL STAPHYLOCOCCUS AUREUS     Time coordinating discharge:32  minutes  SIGNED:   Hosie Poisson, MD  [facility] 08/25/2018, 12:19 PM Pager   If 7PM-7AM, please contact night-coverage [email or web address] Password [email or web address]

## 2018-08-25 NOTE — Transfer of Care (Signed)
Immediate Anesthesia Transfer of Care Note  Patient: Shelly Petty  Procedure(s) Performed: ESOPHAGOGASTRODUODENOSCOPY (EGD) WITH PROPOFOL (N/A )  Patient Location: Endoscopy Unit  Anesthesia Type:MAC  Level of Consciousness: drowsy  Airway & Oxygen Therapy: Patient Spontanous Breathing and Patient connected to nasal cannula oxygen  Post-op Assessment: Report given to RN and Post -op Vital signs reviewed and stable  Post vital signs: Reviewed and stable  Last Vitals:  Vitals Value Taken Time  BP    Temp    Pulse    Resp    SpO2      Last Pain:  Vitals:   08/25/18 0957  TempSrc: Oral  PainSc: 0-No pain         Complications: No apparent anesthesia complications

## 2018-08-25 NOTE — Interval H&P Note (Signed)
History and Physical Interval Note:  08/25/2018 10:25 AM  Shelly Petty  has presented today for surgery, with the diagnosis of Nausea, vomiting.  The various methods of treatment have been discussed with the patient and family. After consideration of risks, benefits and other options for treatment, the patient has consented to  Procedure(s): ESOPHAGOGASTRODUODENOSCOPY (EGD) WITH PROPOFOL (N/A) as a surgical intervention.  The patient's history has been reviewed, patient examined, no change in status, stable for surgery.  I have reviewed the patient's chart and labs.  Questions were answered to the patient's satisfaction.     Jackquline Denmark

## 2018-08-25 NOTE — Anesthesia Preprocedure Evaluation (Addendum)
Anesthesia Evaluation  Patient identified by MRN, date of birth, ID band Patient awake    Reviewed: Allergy & Precautions, NPO status , Patient's Chart, lab work & pertinent test results  Airway Mallampati: I  TM Distance: >3 FB Neck ROM: Full    Dental  (+) Teeth Intact, Dental Advisory Given   Pulmonary former smoker,    breath sounds clear to auscultation       Cardiovascular negative cardio ROS   Rhythm:Regular Rate:Normal     Neuro/Psych Anxiety Depression    GI/Hepatic negative GI ROS, Neg liver ROS,   Endo/Other  negative endocrine ROS  Renal/GU negative Renal ROS  negative genitourinary   Musculoskeletal negative musculoskeletal ROS (+)   Abdominal Normal abdominal exam  (+)   Peds  Hematology   Anesthesia Other Findings   Reproductive/Obstetrics negative OB ROS                            Anesthesia Physical Anesthesia Plan  ASA: II  Anesthesia Plan: MAC   Post-op Pain Management:    Induction: Intravenous  PONV Risk Score and Plan: 0 and Propofol infusion  Airway Management Planned: Natural Airway and Simple Face Mask  Additional Equipment: None  Intra-op Plan:   Post-operative Plan:   Informed Consent: I have reviewed the patients History and Physical, chart, labs and discussed the procedure including the risks, benefits and alternatives for the proposed anesthesia with the patient or authorized representative who has indicated his/her understanding and acceptance.     Dental advisory given  Plan Discussed with: CRNA  Anesthesia Plan Comments:       Anesthesia Quick Evaluation

## 2018-08-25 NOTE — Anesthesia Postprocedure Evaluation (Signed)
Anesthesia Post Note  Patient: Shelly Petty  Procedure(s) Performed: ESOPHAGOGASTRODUODENOSCOPY (EGD) WITH PROPOFOL (N/A ) BIOPSY     Patient location during evaluation: PACU Anesthesia Type: MAC Level of consciousness: awake and alert Pain management: pain level controlled Vital Signs Assessment: post-procedure vital signs reviewed and stable Respiratory status: spontaneous breathing, nonlabored ventilation, respiratory function stable and patient connected to nasal cannula oxygen Cardiovascular status: stable and blood pressure returned to baseline Postop Assessment: no apparent nausea or vomiting Anesthetic complications: no    Last Vitals:  Vitals:   08/25/18 1100 08/25/18 1105  BP: (!) 88/47 (!) 99/49  Pulse: 63 64  Resp: 17 12  Temp:    SpO2: 100% 100%    Last Pain:  Vitals:   08/25/18 1105  TempSrc:   PainSc: 0-No pain                 Effie Berkshire

## 2018-08-25 NOTE — Op Note (Signed)
[facility] Patient Name: Shelly Petty Procedure Date: 08/25/2018 MRN: [id number] Attending MD: Jackquline Denmark , MD Date of Birth: 03/06/1996 CSN: [id number] Age: 23 Admit Type: Inpatient Procedure:                Upper GI endoscopy Indications:              Persistent vomiting of unknown cause Providers:                Jackquline Denmark, MD, Glori Bickers, RN, Carlyn Reichert,                            RN, Charolette Child, Technician, Danley Danker, CRNA Referring MD:              Medicines:                Monitored Anesthesia Care Complications:            No immediate complications. Estimated Blood Loss:     Estimated blood loss: none. Procedure:                Pre-Anesthesia Assessment:                           - Prior to the procedure, a History and Physical                            was performed, and patient medications and                            allergies were reviewed. The patient's tolerance of                            previous anesthesia was also reviewed. The risks                            and benefits of the procedure and the sedation                            options and risks were discussed with the patient.                            All questions were answered, and informed consent                            was obtained. Prior Anticoagulants: The patient has                            taken no previous anticoagulant or antiplatelet                            agents. ASA Grade Assessment: I - A normal, healthy                            patient. After reviewing the risks and benefits,  the patient was deemed in satisfactory condition to                            undergo the procedure.                           After obtaining informed consent, the endoscope was                            passed under direct vision. Throughout the                            procedure, the patient's blood pressure, pulse, and            oxygen saturations were monitored continuously. The                            [id number]-H190 ([id number] Olympus gastroscope was                            introduced through the mouth, and advanced to the                            second part of duodenum. The upper GI endoscopy was                            accomplished without difficulty. The patient                            tolerated the procedure well. Scope In: Scope Out: Findings:      The examined esophagus was normal.      The Z-line was regular and was found 36 cm from the incisors.      The entire examined stomach was normal. Biopsies were taken with a cold       forceps for histology. Estimated blood loss: none.      The examined duodenum was normal. Biopsies for histology were taken with       a cold forceps for evaluation of celiac disease. Estimated blood loss:       none. Impression:               - Normal EGD. Moderate Sedation:      none Recommendation:           - Patient has a contact number available for                            emergencies. The signs and symptoms of potential                            delayed complications were discussed with the                            patient. Return to normal activities tomorrow.                            Written discharge instructions were provided  to the                            patient.                           - Full liquid diet.                           - Continue present medications.                           - Await pathology results.                           - Return to GI clinic. televisit in 3-4 weeks. If                            still with problems, will perform further work-up. Procedure Code(s):        --- Professional ---                           [phone], Esophagogastroduodenoscopy, flexible,                            transoral; with biopsy, single or multiple Diagnosis Code(s):        --- Professional ---                           R11.10,  Vomiting, unspecified CPT copyright 2018 [employer]. All rights reserved. The codes documented in this report are preliminary and upon coder review may  be revised to meet current compliance requirements. Jackquline Denmark, MD 08/25/2018 10:57:31 AM This report has been signed electronically. Number of Addenda: 0

## 2018-08-26 ENCOUNTER — Ambulatory Visit: Payer: Self-pay | Admitting: *Deleted

## 2018-08-26 ENCOUNTER — Telehealth: Payer: Self-pay | Admitting: Family Medicine

## 2018-08-26 ENCOUNTER — Ambulatory Visit: Payer: Self-pay | Admitting: Family Medicine

## 2018-08-26 ENCOUNTER — Encounter (HOSPITAL_COMMUNITY): Payer: Self-pay | Admitting: Gastroenterology

## 2018-08-26 LAB — GLIADIN ANTIBODIES, SERUM
Antigliadin Abs, IgA: 4 units (ref 0–19)
Gliadin IgG: 1 units (ref 0–19)

## 2018-08-26 LAB — TISSUE TRANSGLUTAMINASE, IGA: Tissue Transglutaminase Ab, IgA: <2 U/mL (ref 0–3)

## 2018-08-26 NOTE — Telephone Encounter (Signed)
Copied from [place] [id number]. Topic: Quick Communication - See Telephone Encounter >> Aug 26, 2018 10:10 AM Burchel, Abbi R wrote: CRM for notification. See Telephone encounter for: 08/26/18.  Pt states she has virtual visit w/Dr Nolon Rod on Monday, but is not sure she should wait until Monday (see MyChart Msg re: staph infection) Pt is concerned about worsening infection and would like a call back asap.  Please advise.   Pt: [phone]   *pt states preferred name is "[place]"

## 2018-08-26 NOTE — Telephone Encounter (Signed)
Pt called stating that she had urine culture done in the hospital  that showed staph aureus on 08/19/2018 but was not released on 2/33/2020; she says that she was not treated for this, and she wants to know about getting IM antibiotics; she says that she was given an antibiotic that starts with an "R", and amoxicillin; she was not able to take the oral amoxicillin because it made her vomit again; she says that she has urethral itching, and urinary urgency, and pain which started on the night of 08/25/2018; recommendations made per nurse triage protocol; spoke with Barnett Applebaum at [facility] and she requests that information be sent to the office for provider review and final disposition; the pt agrees to virtual visit; she can be contacted at [phone] and a message can be left; her e-mail address is [email or web address]; will route per request.  Reason for Disposition . Urinating more frequently than usual (i.e., frequency)  Answer Assessment - Initial Assessment Questions 1. SYMPTOM: "What's the main symptom you're concerned about?" (e.g., frequency, incontinence)     Frequency, itching 2. ONSET: "When did the    start?" 08/25/2018 3. PAIN: "Is there any pain?" If so, ask: "How bad is it?" (Scale: 1-10; mild, moderate, severe)     moderate 4. CAUSE: "What do you think is causing the symptoms?"     Pt had urine cultured and showed staph aureus 5. OTHER SYMPTOMS: "Do you have any other symptoms?" (e.g., fever, flank pain, blood in urine, pain with urination)   Pain, urethral itching 6. PREGNANCY: "Is there any chance you are pregnant?" "When was your last menstrual period?"  no LMP 08/19/2018; negative pregnancy test in ED  Protocols used: URINARY [facility]

## 2018-08-26 NOTE — Telephone Encounter (Signed)
Has an appointment 08/30/2018 with Dr. Bridget Hartshorn to disuses medication refills and care.

## 2018-08-27 LAB — RETICULIN ANTIBODIES, IGA W TITER: Reticulin Ab, IgA: NEGATIVE titer (ref <2.5)

## 2018-08-30 ENCOUNTER — Other Ambulatory Visit: Payer: Self-pay

## 2018-08-30 ENCOUNTER — Telehealth (INDEPENDENT_AMBULATORY_CARE_PROVIDER_SITE_OTHER): Payer: 59 | Admitting: Family Medicine

## 2018-08-30 ENCOUNTER — Encounter: Payer: Self-pay | Admitting: Family Medicine

## 2018-08-30 DIAGNOSIS — R1115 Cyclical vomiting syndrome unrelated to migraine: Secondary | ICD-10-CM | POA: Diagnosis not present

## 2018-08-30 DIAGNOSIS — N390 Urinary tract infection, site not specified: Secondary | ICD-10-CM

## 2018-08-30 DIAGNOSIS — F411 Generalized anxiety disorder: Secondary | ICD-10-CM | POA: Diagnosis not present

## 2018-08-30 DIAGNOSIS — E876 Hypokalemia: Secondary | ICD-10-CM

## 2018-08-30 MED ORDER — FLUCONAZOLE 150 MG PO TABS: 150.0000 mg | ORAL_TABLET | Freq: Once | ORAL | 0 refills | Status: AC

## 2018-08-30 NOTE — Patient Instructions (Signed)
° ° ° °  If you have lab work done today you will be contacted with your lab results within the next 2 weeks.  If you have not heard from us then please contact us. The fastest way to get your results is to register for My Chart. ° ° °IF you received an x-ray today, you will receive an invoice from [facility]. Please contact [facility] at [phone] with questions or concerns regarding your invoice.  ° °IF you received labwork today, you will receive an invoice from [facility]. Please contact [facility] at [phone] with questions or concerns regarding your invoice.  ° °Our billing staff will not be able to assist you with questions regarding bills from these companies. ° °You will be contacted with the lab results as soon as they are available. The fastest way to get your results is to activate your My Chart account. Instructions are located on the last page of this paperwork. If you have not heard from us regarding the results in 2 weeks, please contact this office. °  ° ° ° °

## 2018-08-30 NOTE — Progress Notes (Signed)
CC: transfer of care.  Pt has concerns regarding fmla paperwork, ? Yeast infection and emesis syndrome recovery.
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
## 2018-09-01 NOTE — Telephone Encounter (Signed)
Please advise 

## 2018-11-09 NOTE — Telephone Encounter (Signed)
Saw Dr. Nolon Rod on 08/30/2018 for [facility]

## 2019-03-01 ENCOUNTER — Other Ambulatory Visit: Payer: Self-pay | Admitting: Family Medicine

## 2019-03-01 DIAGNOSIS — N644 Mastodynia: Secondary | ICD-10-CM

## 2019-03-10 ENCOUNTER — Other Ambulatory Visit: Payer: Self-pay

## 2019-03-10 ENCOUNTER — Ambulatory Visit (INDEPENDENT_AMBULATORY_CARE_PROVIDER_SITE_OTHER): Payer: 59 | Admitting: Gastroenterology

## 2019-03-10 ENCOUNTER — Encounter: Payer: Self-pay | Admitting: Gastroenterology

## 2019-03-10 DIAGNOSIS — R1115 Cyclical vomiting syndrome unrelated to migraine: Secondary | ICD-10-CM | POA: Diagnosis not present
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
## 2019-03-10 NOTE — Patient Instructions (Signed)
Congratulations on finding ways to successfully manage and prevent your nausea and vomiting.  I have not changed any of your medications today.  I wish you a successful evaluation with the nutritionist tomorrow.  Please contact me if I can provide any assistance in the future.

## 2019-03-25 ENCOUNTER — Ambulatory Visit
Admission: RE | Admit: 2019-03-25 | Discharge: 2019-03-25 | Disposition: A | Discharge status: Home / Self Care | Payer: 59 | Source: Ambulatory Visit | Attending: Family Medicine | Admitting: Family Medicine

## 2019-03-25 ENCOUNTER — Other Ambulatory Visit: Payer: Self-pay

## 2019-03-25 DIAGNOSIS — N644 Mastodynia: Secondary | ICD-10-CM

## 2019-12-10 IMAGING — US ULTRASOUND ABDOMEN COMPLETE
1 series · 14 of 25 positions shown · non-contrast
Comparison: CT on 08/23/2018

CLINICAL DATA: Abdominal pain with nausea and vomiting.

EXAM:
ABDOMEN ULTRASOUND COMPLETE

[Series 1: ultrasound abdomen complete · 14 of 201 slices shown]
[im 1/201]
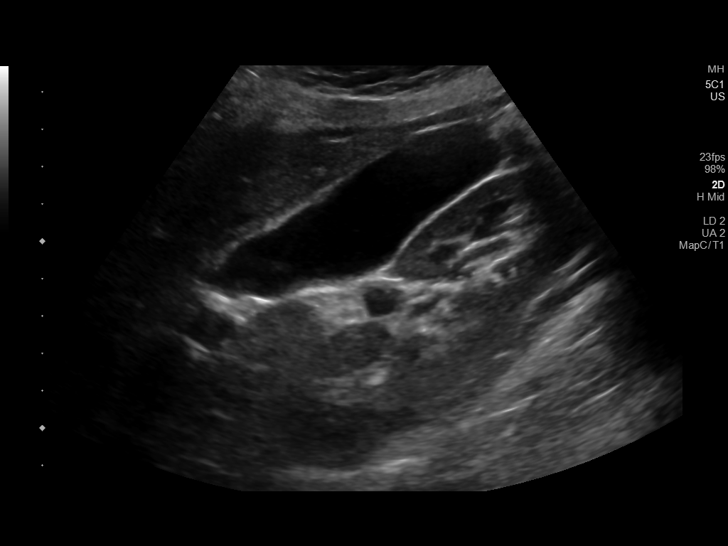
[im 17/201]
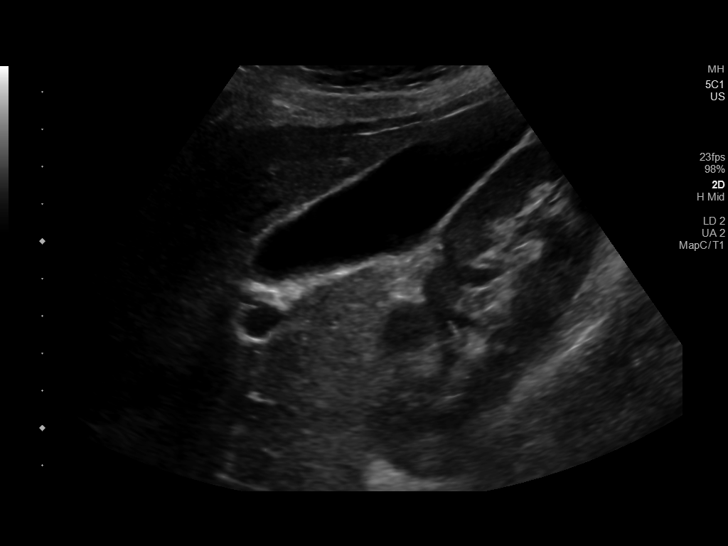
[im 34/201]
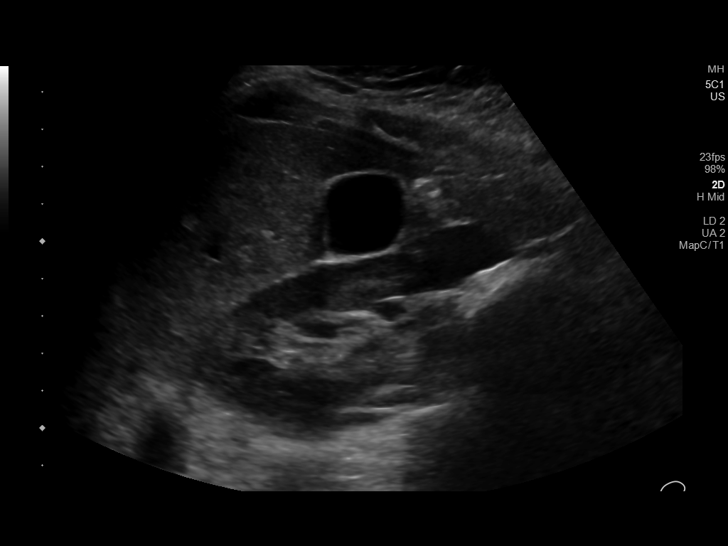
[im 51/201]
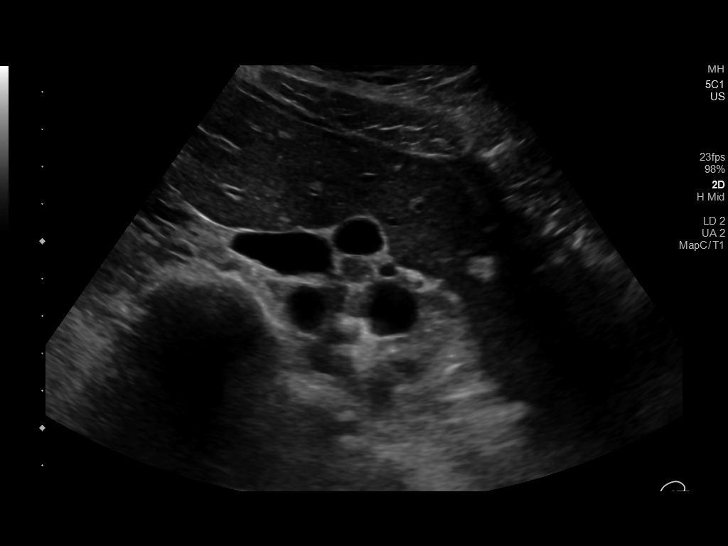
[im 67/201]
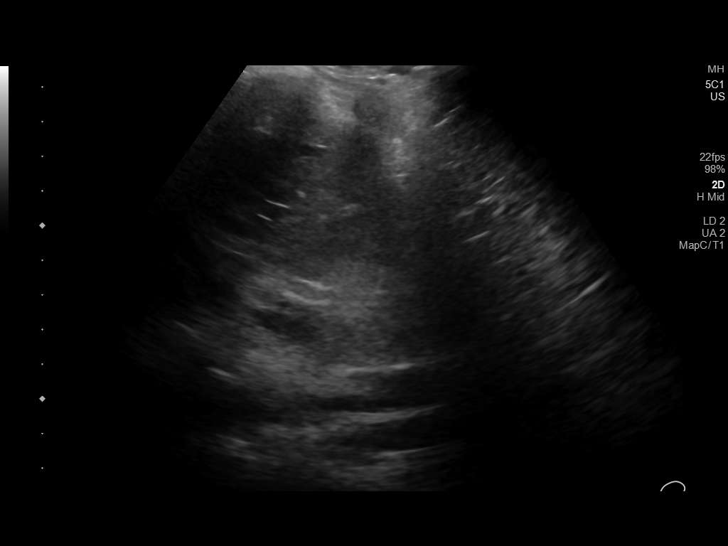
[im 76/201]
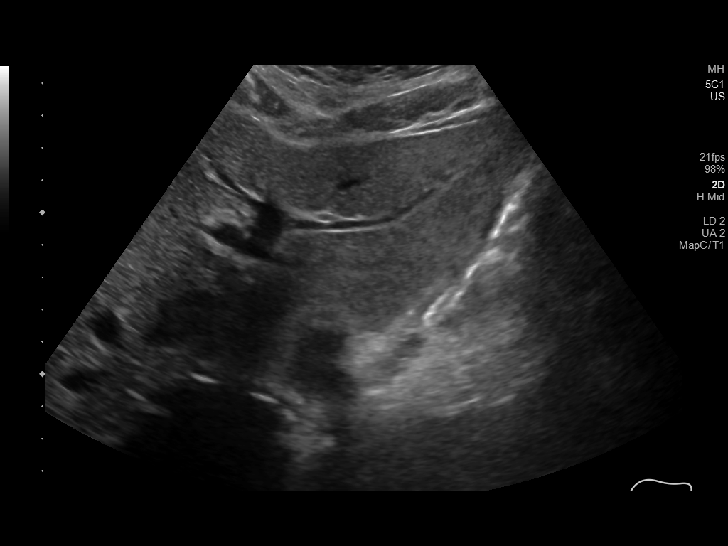
[im 92/201]
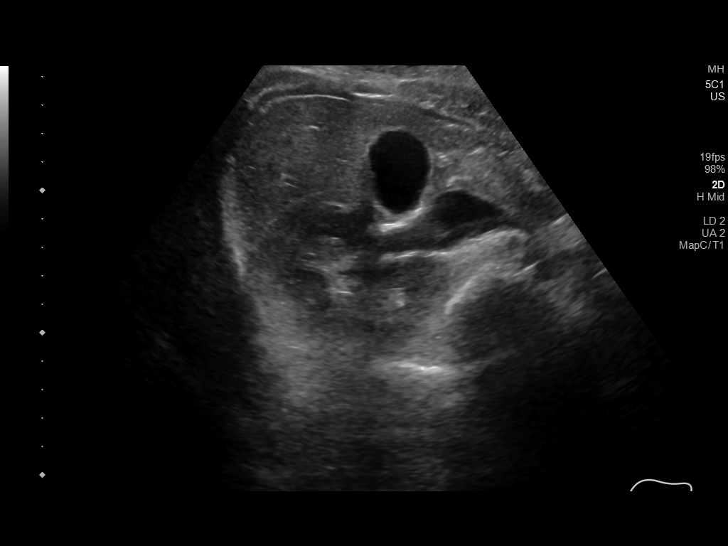
[im 109/201]
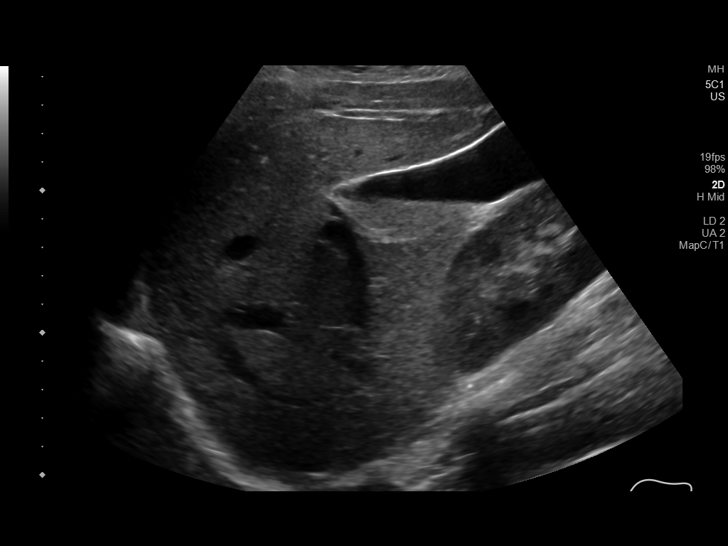
[im 126/201]
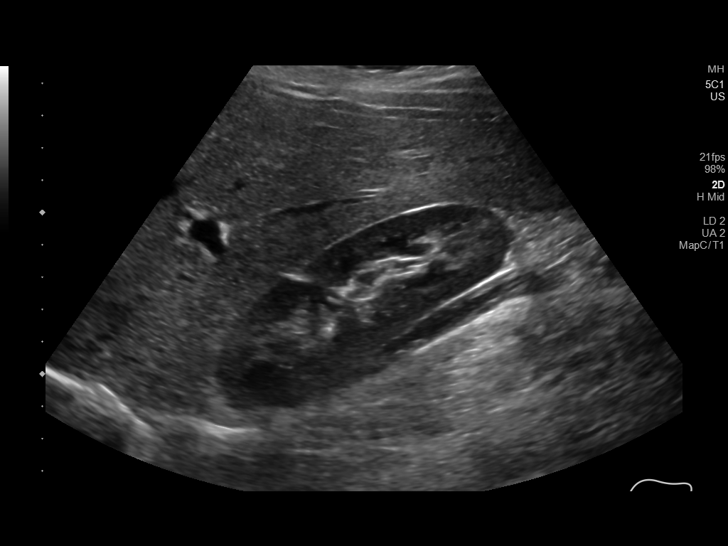
[im 134/201]
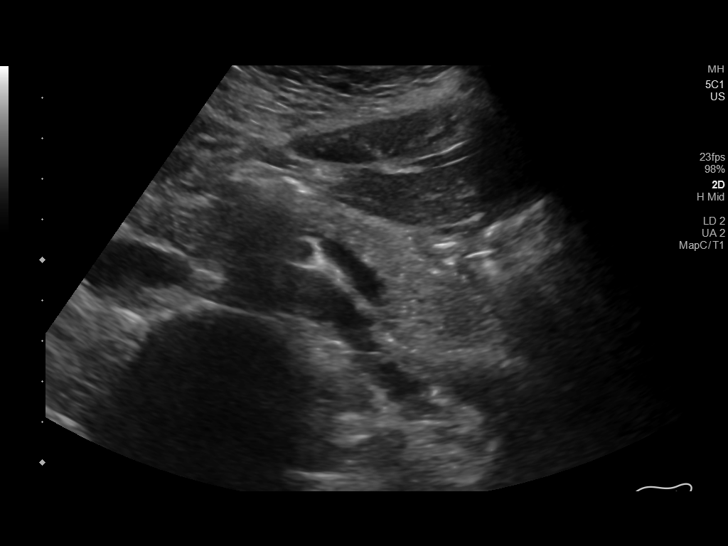
[im 151/201]
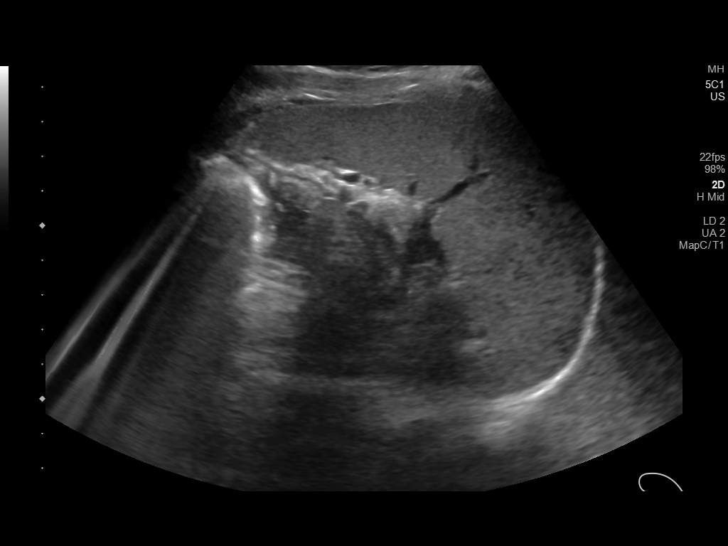
[im 167/201]
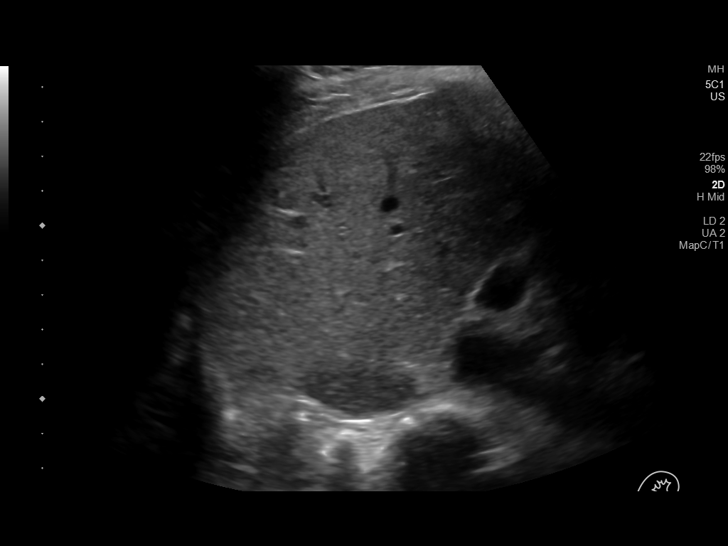
[im 184/201]
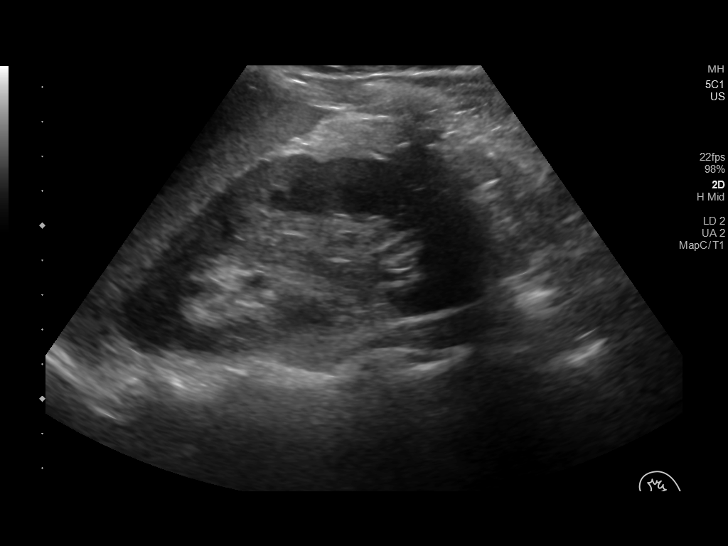
[im 201/201]
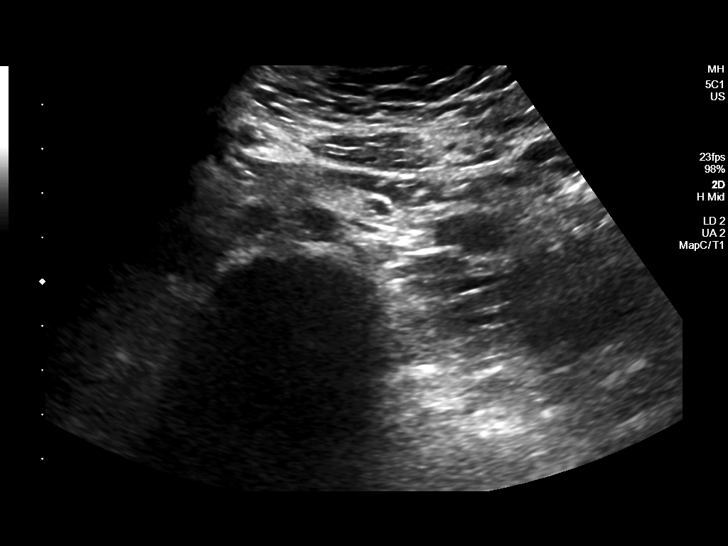

[14 of 25 positions shown; findings below may reference images not displayed]

FINDINGS: Gallbladder: No gallstones or wall thickening visualized. No
sonographic Murphy sign noted by sonographer.

Common bile duct: Diameter: 4 mm, within normal limits.

Liver: No focal lesion identified. Within normal limits in
parenchymal echogenicity. Portal vein is patent on color Doppler
imaging with normal direction of blood flow towards the liver.

IVC: No abnormality visualized.

Pancreas: Visualized portion unremarkable.

Spleen: Size and appearance within normal limits.

Right Kidney: Length: 10.6 cm. Echogenicity within normal limits. No
mass or hydronephrosis visualized.

Left Kidney: Length: 11.8 cm. Echogenicity within normal limits. No
mass or hydronephrosis visualized.

Abdominal aorta: No aneurysm visualized.

Other findings: None.
IMPRESSION: Negative abdomen ultrasound.

## 2020-07-10 IMAGING — US US BREAST*R* LIMITED INC AXILLA
1 series · 12 of 15 positions shown · non-contrast
Comparison: 05/07/2018
COMPARISON: 05/07/2018

Addendum:
CLINICAL DATA: Followup for bilateral probably benign lesions. On
recent physical exam, palpable abnormalities are noted in the 7
o'clock, 9 o'clock, and 2 o'clock locations of the RIGHT breast and
the 2:30 o'clock location of the LEFT breast.

EXAM:
ULTRASOUND OF THE BILATERAL BREAST

[Series 1: us breast*right* limited inc axilla · 0.09mm/px · 12 of 15 slices shown]
[im 1/15]
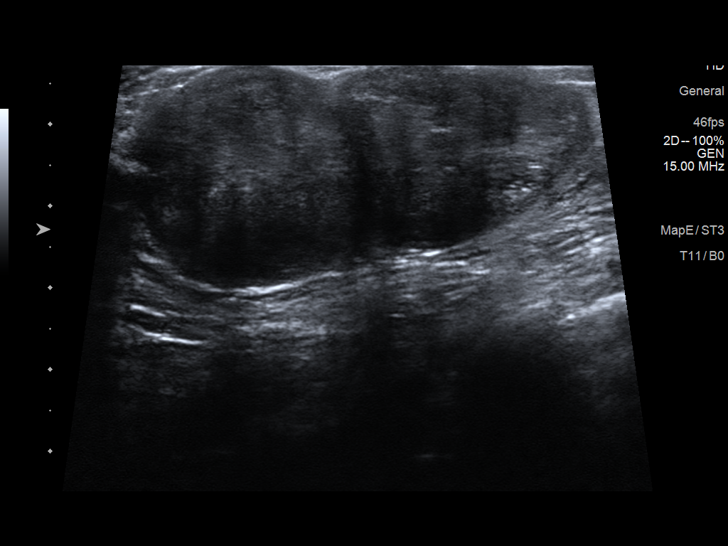
[im 2/15]
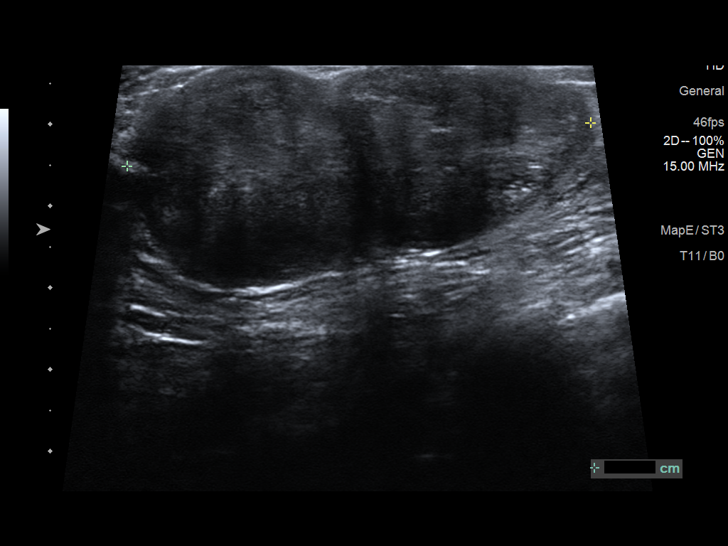
[im 4/15]
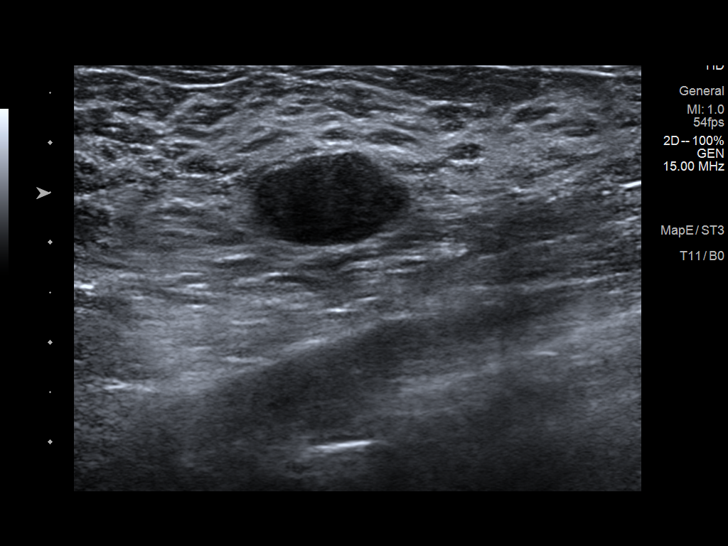
[im 5/15]
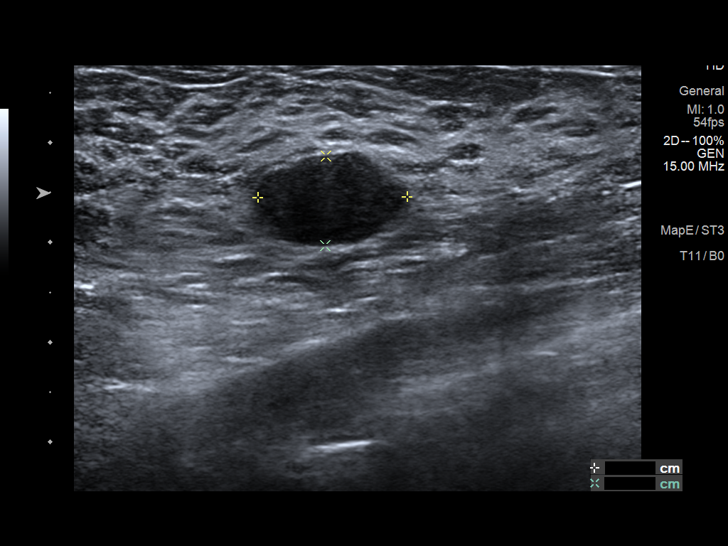
[im 6/15]
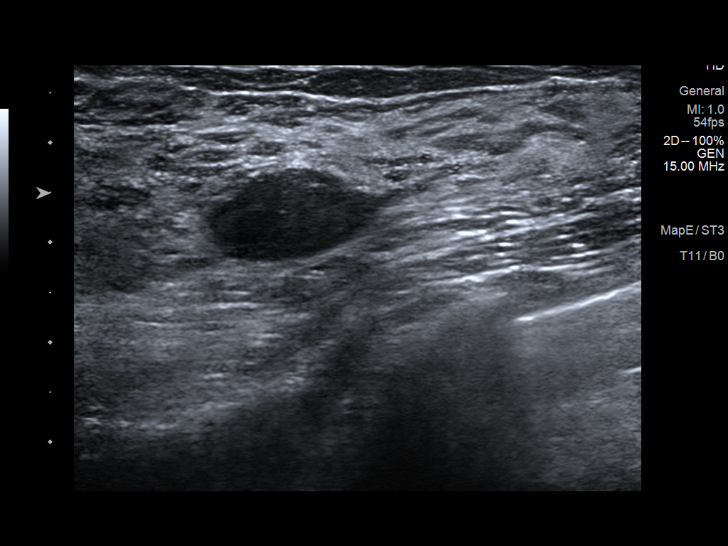
[im 7/15]
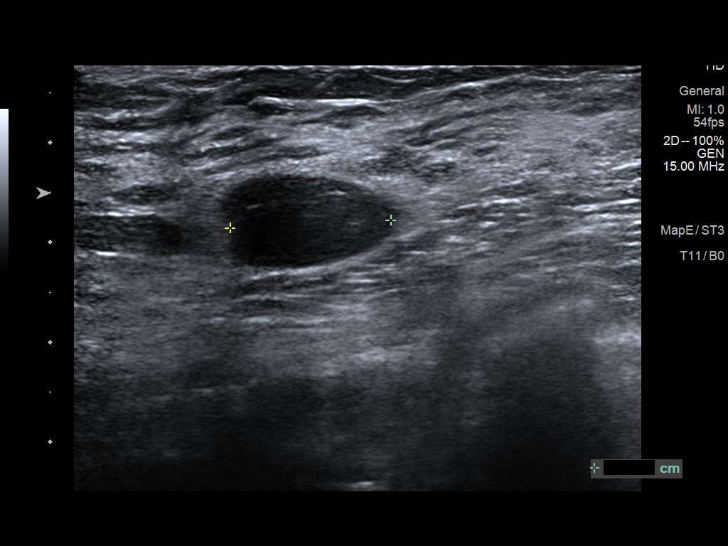
[im 9/15]
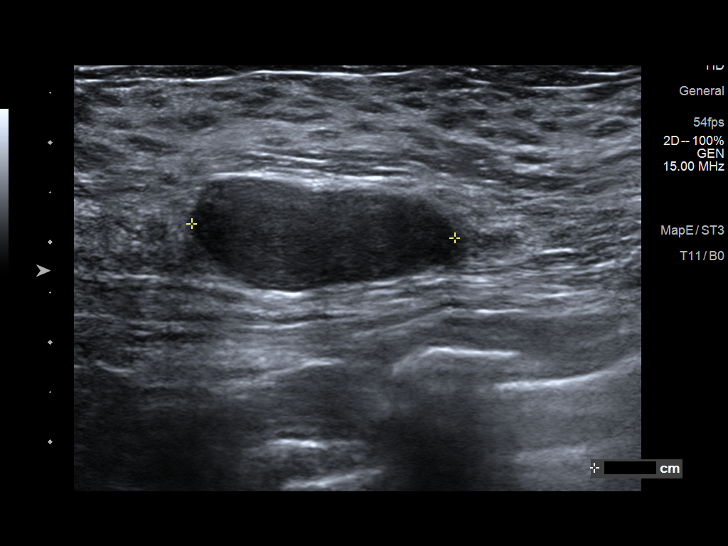
[im 10/15]
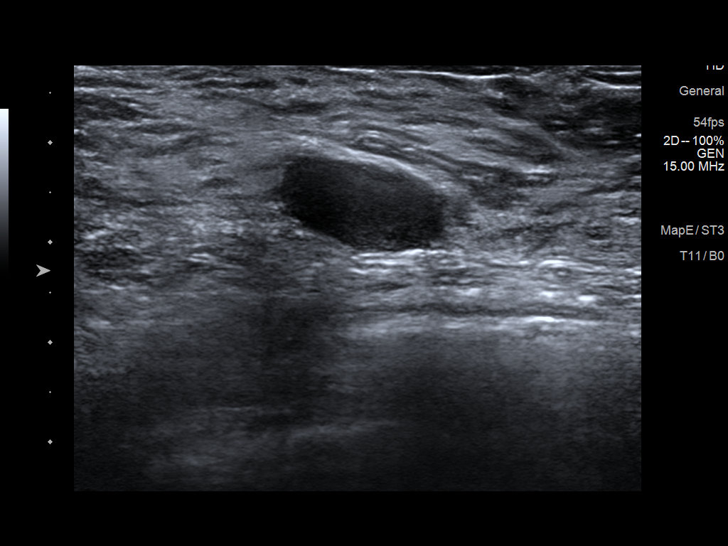
[im 11/15]
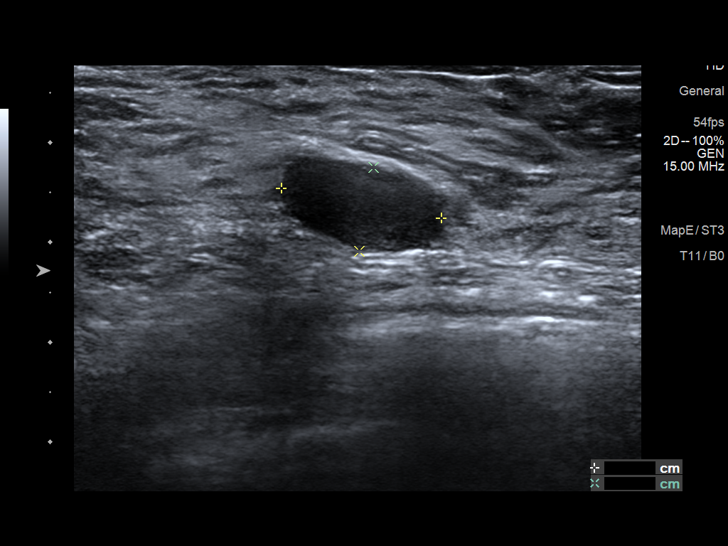
[im 12/15]
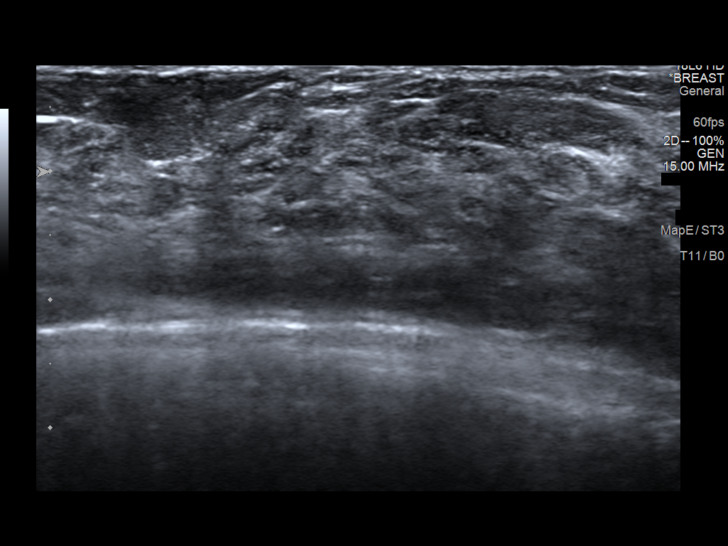
[im 14/15]
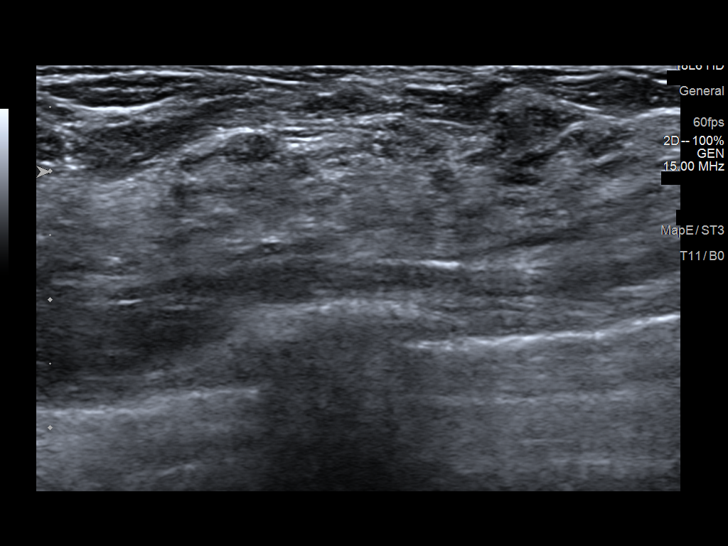
[im 15/15]
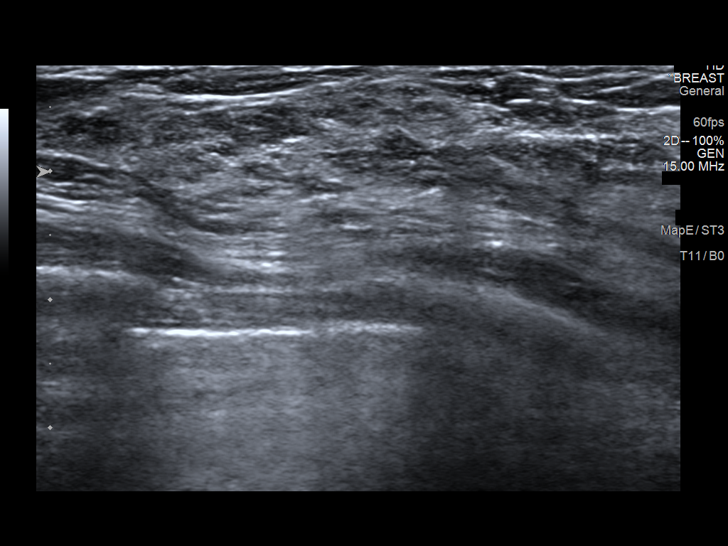

[12 of 15 positions shown; findings below may reference images not displayed]

FINDINGS: On physical exam, I palpate a discrete mobile mass in the 2 o'clock
location of the RIGHT breast.

RIGHT breast: Targeted ultrasound is performed, showing a
circumscribed oval hypoechoic parallel mass in the 2:30 o'clock
location 2 centimeters from the nipple measuring 5.7 x 3.6 x
centimeters.

In the 1 o'clock location 3 centimeters from nipple, circumscribed
oval parallel mass is 1.5 x 0.9 x 1.6 centimeters

In 10:30 o'clock location 4 centimeters from nipple, an oval
circumscribed parallel mass is 2.6 x 1.6 x 0.9 centimeters. No
abnormality identified in the 9 o'clock or 7 o'clock locations.

LEFT breast: Targeted ultrasound performed in areas of concern in
the LEFT breast. In the 7 o'clock location 2 centimeters from
nipple, stable circumscribed parallel oval mass is 1.6 x 2.1 x
centimeters. No abnormality is identified in the 2:30 o'clock
location.
IMPRESSION: Stable bilateral masses consistent benign fibroadenomas.

The patient reports the largest mass in the UPPER INNER QUADRANT of
the RIGHT breast is bothersome. She is considering having this
removed. She also has many questions about fibroadenomas in general,
mastectomy, and hormone use. Her questions were answered.

RECOMMENDATION:
1. Recommend surgical consultation to discuss possible removal of 1
or more masses and to discuss other surgical options including
mastectomy.
2. Recommend follow-up bilateral breast ultrasound of any remaining
lesions in 1 year.

I have discussed the findings and recommendations with the patient.
If applicable, a reminder letter will be sent to the patient
regarding the next appointment.

BI-RADS CATEGORY  3: Probably benign.

ADDENDUM:
Per patient request, surgical consultation was arranged with Dr.

Hjzaini Descalsota, RN on 03/25/2019.

*** End of Addendum ***
FINDINGS: On physical exam, I palpate a discrete mobile mass in the 2 o'clock
location of the RIGHT breast.

RIGHT breast: Targeted ultrasound is performed, showing a
circumscribed oval hypoechoic parallel mass in the 2:30 o'clock
location 2 centimeters from the nipple measuring 5.7 x 3.6 x
centimeters.

In the 1 o'clock location 3 centimeters from nipple, circumscribed
oval parallel mass is 1.5 x 0.9 x 1.6 centimeters

In 10:30 o'clock location 4 centimeters from nipple, an oval
circumscribed parallel mass is 2.6 x 1.6 x 0.9 centimeters. No
abnormality identified in the 9 o'clock or 7 o'clock locations.

LEFT breast: Targeted ultrasound performed in areas of concern in
the LEFT breast. In the 7 o'clock location 2 centimeters from
nipple, stable circumscribed parallel oval mass is 1.6 x 2.1 x
centimeters. No abnormality is identified in the 2:30 o'clock
location.
IMPRESSION: Stable bilateral masses consistent benign fibroadenomas.

The patient reports the largest mass in the UPPER INNER QUADRANT of
the RIGHT breast is bothersome. She is considering having this
removed. She also has many questions about fibroadenomas in general,
mastectomy, and hormone use. Her questions were answered.

RECOMMENDATION:
1. Recommend surgical consultation to discuss possible removal of 1
or more masses and to discuss other surgical options including
mastectomy.
2. Recommend follow-up bilateral breast ultrasound of any remaining
lesions in 1 year.

I have discussed the findings and recommendations with the patient.
If applicable, a reminder letter will be sent to the patient
regarding the next appointment.

BI-RADS CATEGORY  3: Probably benign.
